# Patient Record
Sex: Female | Born: 1960 | Race: White | Hispanic: No | Marital: Married | Smoking: Current every day smoker
Health system: Southern US, Community
[De-identification: ages and names within clinical notes are randomized; demographics above are authoritative.]

## PROBLEM LIST (undated history)

## (undated) DIAGNOSIS — T7840XA Allergy, unspecified, initial encounter: Secondary | ICD-10-CM

## (undated) DIAGNOSIS — J42 Unspecified chronic bronchitis: Secondary | ICD-10-CM

## (undated) DIAGNOSIS — Z720 Tobacco use: Secondary | ICD-10-CM

## (undated) DIAGNOSIS — K219 Gastro-esophageal reflux disease without esophagitis: Secondary | ICD-10-CM

## (undated) HISTORY — DX: Allergy, unspecified, initial encounter: T78.40XA

## (undated) HISTORY — DX: Tobacco use: Z72.0

## (undated) HISTORY — DX: Unspecified chronic bronchitis: J42

## (undated) HISTORY — DX: Gastro-esophageal reflux disease without esophagitis: K21.9

---

## 2005-05-30 ENCOUNTER — Ambulatory Visit: Payer: Self-pay

## 2005-06-13 ENCOUNTER — Ambulatory Visit: Payer: Self-pay | Admitting: Unknown Physician Specialty

## 2006-06-27 ENCOUNTER — Encounter: Payer: Self-pay | Admitting: Specialist

## 2006-06-29 ENCOUNTER — Encounter: Payer: Self-pay | Admitting: Specialist

## 2006-07-29 ENCOUNTER — Encounter: Payer: Self-pay | Admitting: Specialist

## 2006-09-04 ENCOUNTER — Ambulatory Visit: Payer: Self-pay

## 2007-11-26 ENCOUNTER — Ambulatory Visit: Payer: Self-pay

## 2008-12-01 ENCOUNTER — Ambulatory Visit: Payer: Self-pay

## 2010-01-29 LAB — HM COLONOSCOPY: HM Colonoscopy: NORMAL

## 2010-04-17 ENCOUNTER — Emergency Department: Payer: Self-pay | Admitting: Emergency Medicine

## 2010-04-19 ENCOUNTER — Ambulatory Visit: Payer: Self-pay | Admitting: Internal Medicine

## 2010-05-12 ENCOUNTER — Ambulatory Visit: Payer: Self-pay | Admitting: Internal Medicine

## 2010-06-07 ENCOUNTER — Ambulatory Visit: Payer: Self-pay | Admitting: Gastroenterology

## 2010-06-12 LAB — PATHOLOGY REPORT

## 2011-08-10 ENCOUNTER — Other Ambulatory Visit: Payer: Self-pay | Admitting: Internal Medicine

## 2011-08-10 MED ORDER — CITALOPRAM HYDROBROMIDE 20 MG PO TABS: 20.0000 mg | ORAL_TABLET | Freq: Every day | ORAL | Status: DC

## 2011-09-24 ENCOUNTER — Encounter: Payer: Self-pay | Admitting: Internal Medicine

## 2011-09-24 ENCOUNTER — Ambulatory Visit (INDEPENDENT_AMBULATORY_CARE_PROVIDER_SITE_OTHER): Payer: PRIVATE HEALTH INSURANCE | Admitting: Internal Medicine

## 2011-09-24 DIAGNOSIS — Z7189 Other specified counseling: Secondary | ICD-10-CM

## 2011-09-24 DIAGNOSIS — F410 Panic disorder [episodic paroxysmal anxiety] without agoraphobia: Secondary | ICD-10-CM

## 2011-09-24 DIAGNOSIS — Z716 Tobacco abuse counseling: Secondary | ICD-10-CM

## 2011-09-24 DIAGNOSIS — F329 Major depressive disorder, single episode, unspecified: Secondary | ICD-10-CM

## 2011-09-24 DIAGNOSIS — I10 Essential (primary) hypertension: Secondary | ICD-10-CM

## 2011-09-24 MED ORDER — AMLODIPINE BESYLATE 5 MG PO TABS: 5.0000 mg | ORAL_TABLET | Freq: Every day | ORAL | Status: DC

## 2011-09-24 MED ORDER — CITALOPRAM HYDROBROMIDE 20 MG PO TABS: 40.0000 mg | ORAL_TABLET | Freq: Every day | ORAL | Status: DC

## 2011-09-24 MED ORDER — DIAZEPAM 5 MG PO TABS: 5.0000 mg | ORAL_TABLET | Freq: Two times a day (BID) | ORAL | Status: DC | PRN

## 2011-09-24 MED ORDER — ALPRAZOLAM 0.5 MG PO TABS: 0.5000 mg | ORAL_TABLET | Freq: Every evening | ORAL | Status: DC | PRN

## 2011-09-24 NOTE — Patient Instructions (Signed)
I want you to take a 20 minute walk outside during the day 3 days a week ,  Goal 5  Come back in one month

## 2011-09-24 NOTE — Progress Notes (Signed)
  Subjective:    Patient ID: Ann Baxter, female    DOB: 1961/07/15, 50 y.o.   MRN: [id number]  HPI  Ann Baxter is a 50 you white female with a history of tobacco abuse, anxiety, moderate alvohol use who presents with full blown depression accompanied by daily panic attacks.  Her current symptoms were precipitated by the recent death of father and several (6) close friends since June.She received Hospice counselling for a few weeks but stopped because she didn't see the benefit in talking about death over and over again. Her symptoms are aggravated by menopause, as she is experiencing hot flashes,  Lack of libido,  insomnia, inactitvy, loss of motivation, anhedonia, hyperphagia.  She is not suicidal.  Prior trials of effexor caused insomnia and itching.  She currently taking celexa 20 mg daily with no appreciable change in symptoms since June , her last visit .     Review of Systems  Constitutional: Positive for appetite change. Negative for fever, chills and unexpected weight change.  HENT: Negative for hearing loss, ear pain, nosebleeds, congestion, sore throat, facial swelling, rhinorrhea, sneezing, mouth sores, trouble swallowing, neck pain, neck stiffness, voice change, postnasal drip, sinus pressure, tinnitus and ear discharge.   Eyes: Negative for pain, discharge, redness and visual disturbance.  Respiratory: Negative for cough, chest tightness, shortness of breath, wheezing and stridor.   Cardiovascular: Negative for chest pain, palpitations and leg swelling.  Musculoskeletal: Negative for myalgias and arthralgias.  Skin: Negative for color change and rash.  Neurological: Negative for dizziness, weakness, light-headedness and headaches.  Hematological: Negative for adenopathy.  Psychiatric/Behavioral: Positive for sleep disturbance and dysphoric mood. Negative for suicidal ideas and self-injury. The patient is nervous/anxious.        Objective:   Physical Exam  Constitutional: She is  oriented to person, place, and time. She appears well-developed and well-nourished.  HENT:  Mouth/Throat: Oropharynx is clear and moist.  Eyes: EOM are normal. Pupils are equal, round, and reactive to light. No scleral icterus.  Neck: Normal range of motion. Neck supple. No JVD present. No thyromegaly present.  Cardiovascular: Normal rate, regular rhythm, normal heart sounds and intact distal pulses.   Pulmonary/Chest: Effort normal and breath sounds normal.  Abdominal: Soft. Bowel sounds are normal. She exhibits no mass. There is no tenderness.  Musculoskeletal: Normal range of motion. She exhibits no edema.  Lymphadenopathy:    She has no cervical adenopathy.  Neurological: She is alert and oriented to person, place, and time.  Skin: Skin is warm and dry.  Psychiatric: Her speech is normal and behavior is normal. Judgment and thought content normal. Her mood appears anxious. Cognition and memory are normal. She exhibits a depressed mood.       Assessment & Plan:  Depression:  Discussed options of increasing her citalopram to 40 mg daily vs change to wellbutrin which may provide the benefit of decreased tobacco dependence.  Since she is not motivated to quit currently, I assessed her risk for tobacco cessation failure currently to be high.  Will increase the citalopram.  Referral to  behavioral health for psychotherapy.  Panic disorder.  painc attacks occurring frequently,  No history of PTSD or domestic violence.  Trial of alprazolam for prn use.  F/u in one monht.

## 2011-09-25 ENCOUNTER — Encounter: Payer: Self-pay | Admitting: Internal Medicine

## 2011-09-25 DIAGNOSIS — Z720 Tobacco use: Secondary | ICD-10-CM | POA: Insufficient documentation

## 2011-09-25 DIAGNOSIS — J42 Unspecified chronic bronchitis: Secondary | ICD-10-CM | POA: Insufficient documentation

## 2011-09-25 DIAGNOSIS — F331 Major depressive disorder, recurrent, moderate: Secondary | ICD-10-CM | POA: Insufficient documentation

## 2011-09-25 DIAGNOSIS — Z716 Tobacco abuse counseling: Secondary | ICD-10-CM | POA: Insufficient documentation

## 2011-09-25 DIAGNOSIS — K219 Gastro-esophageal reflux disease without esophagitis: Secondary | ICD-10-CM | POA: Insufficient documentation

## 2011-09-25 DIAGNOSIS — T7840XA Allergy, unspecified, initial encounter: Secondary | ICD-10-CM | POA: Insufficient documentation

## 2011-09-25 NOTE — Assessment & Plan Note (Signed)
We discussed her risks of CA, CAD, and COPD and assessed her current level of interest in quitting.  She understands the need to quit but gien her uncontrolled depression and anxiety does not feel she can take on this challenge at this time.  Will repeat discussion at one month in followup.

## 2011-10-26 ENCOUNTER — Ambulatory Visit (INDEPENDENT_AMBULATORY_CARE_PROVIDER_SITE_OTHER): Payer: PRIVATE HEALTH INSURANCE | Admitting: Internal Medicine

## 2011-10-26 ENCOUNTER — Encounter: Payer: Self-pay | Admitting: Internal Medicine

## 2011-10-26 VITALS — BP 122/70 | HR 100 | Temp 98.8°F | Wt 142.0 lb

## 2011-10-26 DIAGNOSIS — F329 Major depressive disorder, single episode, unspecified: Secondary | ICD-10-CM

## 2011-10-26 DIAGNOSIS — K219 Gastro-esophageal reflux disease without esophagitis: Secondary | ICD-10-CM

## 2011-10-26 DIAGNOSIS — Z23 Encounter for immunization: Secondary | ICD-10-CM

## 2011-10-26 MED ORDER — CITALOPRAM HYDROBROMIDE 40 MG PO TABS: 40.0000 mg | ORAL_TABLET | Freq: Every day | ORAL | Status: DC

## 2011-10-26 MED ORDER — OMEPRAZOLE 40 MG PO CPDR: 40.0000 mg | DELAYED_RELEASE_CAPSULE | Freq: Every day | ORAL | Status: DC

## 2011-10-26 NOTE — Patient Instructions (Addendum)
Continue 40 mg celexa.  Use the valium as needed for tension headaches and overall tnesion  Use the alprazolam for extreme anxiety.    Return in 3 months for a full physical including PAP smear

## 2011-10-26 NOTE — Progress Notes (Signed)
  Subjective:    Patient ID: Ann Baxter, female    DOB: [birth year], 50 y.o.   MRN: [id number]  HPI  Ann Baxter returns for one month for follow up on titrate of medications for treatment  of major depressive episode precipitated by the death of her father several months ago.     She feesl significantly better, on the 40 mg of celexa daily.  She reports less tearfulness, insomnia and anxeity and her husband has commented on the significant difference as well.  She is using 1/2 tablet of alprazolam as needed for extreme anxiety less than once daily, and has used the valium rarely, mostly for treatment of muscle tension headaches.  Her weight loss has stopped and she has  gained 3 lbs.  She is not exericising yet, and her libido is low but she is not worried about that currently.  Past Medical History  Diagnosis Date  . Allergy   . Bronchitis, chronic   . Tobacco abuse   . GERD (gastroesophageal reflux disease)     H Pylori Positive, treated July 2011   Current Outpatient Prescriptions on File Prior to Visit  Medication Sig Dispense Refill  . ALPRAZolam (XANAX) 0.5 MG tablet Take 1 tablet (0.5 mg total) by mouth at bedtime as needed (panic attack).  30 tablet  3  . amLODipine (NORVASC) 5 MG tablet Take 1 tablet (5 mg total) by mouth daily.  30 tablet  11  . cetirizine (ZYRTEC) 10 MG tablet Take 10 mg by mouth daily.        . Multiple Vitamin (MULTIVITAMIN) tablet Take 1 tablet by mouth daily.        . diazepam (VALIUM) 5 MG tablet Take 1 tablet (5 mg total) by mouth every 12 (twelve) hours as needed (muscle spasm ).  60 tablet  3    Review of Systems  Constitutional: Negative for fever, chills and unexpected weight change.  HENT: Negative for hearing loss, ear pain, nosebleeds, congestion, sore throat, facial swelling, rhinorrhea, sneezing, mouth sores, trouble swallowing, neck pain, neck stiffness, voice change, postnasal drip, sinus pressure, tinnitus and ear discharge.   Eyes: Negative for  pain, discharge, redness and visual disturbance.  Respiratory: Negative for cough, chest tightness, shortness of breath, wheezing and stridor.   Cardiovascular: Negative for chest pain, palpitations and leg swelling.  Musculoskeletal: Negative for myalgias and arthralgias.  Skin: Negative for color change and rash.  Neurological: Negative for dizziness, weakness, light-headedness and headaches.  Hematological: Negative for adenopathy.  Psychiatric/Behavioral: The patient is nervous/anxious.       BP 122/70  Pulse 100  Temp(Src) 98.8 F (37.1 C) (Oral)  Wt 142 lb (64.411 kg)     Objective:   Physical Exam  Constitutional: She is oriented to person, place, and time. She appears well-developed and well-nourished.  Eyes: No scleral icterus.  Neck: Normal range of motion. Neck supple. No JVD present. No thyromegaly present.  Cardiovascular: Normal rate, regular rhythm, normal heart sounds and intact distal pulses.   Pulmonary/Chest: Effort normal and breath sounds normal.  Musculoskeletal: Normal range of motion. She exhibits no edema.  Lymphadenopathy:    She has no cervical adenopathy.  Neurological: She is alert and oriented to person, place, and time.  Skin: Skin is warm and dry.  Psychiatric: She has a normal mood and affect.          Assessment & Plan:  1) NMajor depressive disorede 2) Hypertension 3) Tobacco abuse

## 2011-10-28 ENCOUNTER — Encounter: Payer: Self-pay | Admitting: Internal Medicine

## 2011-10-28 NOTE — Assessment & Plan Note (Signed)
Improved symptoms with increased celexa dose of 40 mg daily.  Continue for another 3 months.  Will review sympotoms again at her next visit.

## 2011-11-01 LAB — HM MAMMOGRAPHY: HM Mammogram: NORMAL

## 2011-12-12 ENCOUNTER — Ambulatory Visit: Payer: Self-pay | Admitting: Internal Medicine

## 2011-12-13 ENCOUNTER — Telehealth: Payer: Self-pay | Admitting: Internal Medicine

## 2011-12-13 NOTE — Telephone Encounter (Signed)
Her mammogram was normal.  Repeat in one year 

## 2011-12-24 ENCOUNTER — Encounter: Payer: Self-pay | Admitting: *Deleted

## 2011-12-24 NOTE — Telephone Encounter (Signed)
Letter mailed notifying patient that mammogram was normal.

## 2012-01-03 ENCOUNTER — Encounter: Payer: Self-pay | Admitting: Internal Medicine

## 2012-01-25 ENCOUNTER — Encounter: Payer: PRIVATE HEALTH INSURANCE | Admitting: Internal Medicine

## 2012-01-30 ENCOUNTER — Ambulatory Visit (INDEPENDENT_AMBULATORY_CARE_PROVIDER_SITE_OTHER): Payer: PRIVATE HEALTH INSURANCE | Admitting: Internal Medicine

## 2012-01-30 ENCOUNTER — Encounter: Payer: Self-pay | Admitting: Internal Medicine

## 2012-01-30 ENCOUNTER — Other Ambulatory Visit (HOSPITAL_COMMUNITY)
Admission: RE | Admit: 2012-01-30 | Discharge: 2012-01-30 | Disposition: A | Discharge status: Home / Self Care | Payer: PRIVATE HEALTH INSURANCE | Source: Ambulatory Visit | Attending: Internal Medicine | Admitting: Internal Medicine

## 2012-01-30 DIAGNOSIS — Z1159 Encounter for screening for other viral diseases: Secondary | ICD-10-CM | POA: Insufficient documentation

## 2012-01-30 DIAGNOSIS — F329 Major depressive disorder, single episode, unspecified: Secondary | ICD-10-CM

## 2012-01-30 DIAGNOSIS — J309 Allergic rhinitis, unspecified: Secondary | ICD-10-CM

## 2012-01-30 DIAGNOSIS — Z7189 Other specified counseling: Secondary | ICD-10-CM

## 2012-01-30 DIAGNOSIS — Z716 Tobacco abuse counseling: Secondary | ICD-10-CM

## 2012-01-30 DIAGNOSIS — K219 Gastro-esophageal reflux disease without esophagitis: Secondary | ICD-10-CM

## 2012-01-30 DIAGNOSIS — N760 Acute vaginitis: Secondary | ICD-10-CM | POA: Insufficient documentation

## 2012-01-30 DIAGNOSIS — Z124 Encounter for screening for malignant neoplasm of cervix: Secondary | ICD-10-CM

## 2012-01-30 DIAGNOSIS — F3289 Other specified depressive episodes: Secondary | ICD-10-CM

## 2012-01-30 DIAGNOSIS — Z01419 Encounter for gynecological examination (general) (routine) without abnormal findings: Secondary | ICD-10-CM | POA: Insufficient documentation

## 2012-01-30 DIAGNOSIS — Z0000 Encounter for general adult medical examination without abnormal findings: Secondary | ICD-10-CM

## 2012-01-30 DIAGNOSIS — K209 Esophagitis, unspecified without bleeding: Secondary | ICD-10-CM

## 2012-01-30 MED ORDER — MONTELUKAST SODIUM 10 MG PO TABS: 10.0000 mg | ORAL_TABLET | Freq: Every day | ORAL | Status: DC

## 2012-01-30 MED ORDER — ESOMEPRAZOLE MAGNESIUM 40 MG PO CPDR: 40.0000 mg | DELAYED_RELEASE_CAPSULE | Freq: Every day | ORAL | Status: DC

## 2012-01-30 MED ORDER — HYOSCYAMINE SULFATE 0.125 MG SL SUBL: 0.1250 mg | SUBLINGUAL_TABLET | SUBLINGUAL | Status: DC | PRN

## 2012-01-30 NOTE — Progress Notes (Signed)
Patient ID: Ann Baxter, female   DOB: Apr 06, 1961, 51 y.o.   MRN: [id number] Patient Active Problem List  Diagnoses  . Allergy  . GERD (gastroesophageal reflux disease)  . Bronchitis, chronic  . Tobacco abuse  . Tobacco abuse counseling  . Depressive state  . Rhinitis, allergic  . Annual physical exam    Subjective:  CC:   Chief Complaint  Patient presents with  . Gynecologic Exam    HPI:   Ann Baxter a 51 y.o. female who presents for annual gyn exam and follow up on acute and chronic medical issues. .  Her depression has improved with initiation of therapy and she is starting to get her libido back. She has been having increased dyspepsia.  Has a history of  h pylori , treated in 2011, with recent recurrence of abd/chest pain that woke her up from sleep.  Has not been using a daily PPI., Still smoking.  Third issiue is allergic rhinitis with congestion despite daily use of zyrtec.     Past Medical History  Diagnosis Date  . Allergy   . Bronchitis, chronic   . Tobacco abuse   . GERD (gastroesophageal reflux disease)     H Pylori Positive, treated July 2011    Past Surgical History  Procedure Date  . Cesarean section 1986         The following portions of the patient's history were reviewed and updated as appropriate: Allergies, current medications, and problem list.    Review of Systems:   12 Pt  review of systems was negative except those addressed in the HPI,     History   Social History  . Marital Status: Married    Spouse Name: N/A    Number of Children: N/A  . Years of Education: N/A   Occupational History  . Not on file.   Social History Main Topics  . Smoking status: Current Everyday Smoker -- 1.0 packs/day    Types: Cigarettes  . Smokeless tobacco: Never Used  . Alcohol Use: 3.5 oz/week    7 drink(s) per week  . Drug Use: No  . Sexually Active: Not Currently    Birth Control/ Protection: Abstinence   Other Topics Concern  .  Not on file   Social History Narrative  . No narrative on file    Objective:  BP 120/68  Pulse 100  Temp(Src) 98 F (36.7 C) (Oral)  Resp 16  Wt 140 lb 8 oz (63.73 kg)  SpO2 99%    General Appearance:    Alert, cooperative, no distress, appears stated age  Head:    Normocephalic, without obvious abnormality, atraumatic  Eyes:    PERRL, conjunctiva/corneas clear, EOM's intact, fundi    benign, both eyes  Ears:    Normal TM's and external ear canals, both ears  Nose:   Nares normal, septum midline, mucosa normal, no drainage    or sinus tenderness  Throat:   Lips, mucosa, and tongue normal; teeth and gums normal  Neck:   Supple, symmetrical, trachea midline, no adenopathy;    thyroid:  no enlargement/tenderness/nodules; no carotid   bruit or JVD  Back:     Symmetric, no curvature, ROM normal, no CVA tenderness  Lungs:     Clear to auscultation bilaterally, respirations unlabored  Chest Wall:    No tenderness or deformity   Heart:    Regular rate and rhythm, S1 and S2 normal, no murmur, rub   or gallop  Breast  Exam:    No tenderness, masses, or nipple abnormality  Abdomen:     Soft, non-tender, bowel sounds active all four quadrants,    no masses, no organomegaly  Genitalia:    Normal female without lesion, discharge or tenderness  Rectal:    Normal tone, normal prostate, no masses or tenderness;   guaiac negative stool  Extremities:   Extremities normal, atraumatic, no cyanosis or edema  Pulses:   2+ and symmetric all extremities  Skin:   Skin color, texture, turgor normal, no rashes or lesions  Lymph nodes:   Cervical, supraclavicular, and axillary nodes normal  Neurologic:   CNII-XII intact, normal strength, sensation and reflexes    throughout    Assessment and Plan:  Depressive state improved with initiation of SSRI therapy.  No changes today  Tobacco abuse counseling I have again advised her of the risks for heart disease increased risk of breast cancer and  lung disease with continued tobacco abuse. She is in a contemplative stages she does not want pharmacotherapy at this point since she is just gotten her that her depression under manageable state.  GERD (gastroesophageal reflux disease) She does not want to undergo the testing for H. pylori which at this point would require stool testing. We will resume PPI therapy and if symptoms do not improve she will need to see gastroenterologist.  Rhinitis, allergic Discussed adding a second line agent, Singulair daily Zyrtec. She does not want to use a steroid nasal spray. Prescription for Singulair written.  Annual physical exam Annual breast and pelvic exam with Pap smear was done today. She is up-to-date on mammograms. She has declined any referral for colonoscopy for colon cancer screening.    Updated Medication List Outpatient Encounter Prescriptions as of 01/30/2012  Medication Sig Dispense Refill  . ALPRAZolam (XANAX) 0.5 MG tablet Take 1 tablet (0.5 mg total) by mouth at bedtime as needed (panic attack).  30 tablet  3  . amLODipine (NORVASC) 5 MG tablet Take 1 tablet (5 mg total) by mouth daily.  30 tablet  11  . cetirizine (ZYRTEC) 10 MG tablet Take 10 mg by mouth daily.        . citalopram (CELEXA) 40 MG tablet Take 1 tablet (40 mg total) by mouth daily.  30 tablet  5  . diazepam (VALIUM) 5 MG tablet Take 1 tablet (5 mg total) by mouth every 12 (twelve) hours as needed (muscle spasm ).  60 tablet  3  . Multiple Vitamin (MULTIVITAMIN) tablet Take 1 tablet by mouth daily.        Marland Kitchen omeprazole (PRILOSEC) 40 MG capsule Take 1 capsule (40 mg total) by mouth daily.  30 capsule  5  . esomeprazole (NEXIUM) 40 MG capsule Take 1 capsule (40 mg total) by mouth daily.  30 capsule  3  . hyoscyamine (LEVSIN/SL) 0.125 MG SL tablet Place 1 tablet (0.125 mg total) under the tongue every 4 (four) hours as needed for cramping.  30 tablet  0  . montelukast (SINGULAIR) 10 MG tablet Take 1 tablet (10 mg total) by  mouth at bedtime.  30 tablet  3     Orders Placed This Encounter  Procedures  . HM MAMMOGRAPHY  . HM COLONOSCOPY    No Follow-up on file.

## 2012-01-30 NOTE — Patient Instructions (Signed)
I am prescribing singulair for your allergies .  You can continue zyrtec as well  I am changing your omeprazole to nexium for your stomach problems.  If it does not help,  We will need to do the stool test for h Pylori.   The hyoscyamine is an antispasmodic for your next attack of esophageal pain

## 2012-01-31 ENCOUNTER — Telehealth: Payer: Self-pay | Admitting: *Deleted

## 2012-01-31 NOTE — Telephone Encounter (Signed)
Triage Record Num: [id number] Operator: Jeraldine Loots Patient Name: Ann Baxter Call Date & Time: 01/31/2012 2:48:31PM Patient Phone: [phone] PCP: Duncan Dull Patient Gender: Female PCP Fax : [phone] Patient DOB: Dec 27, 1960 Practice Name: [facility] Reason for Call: Caller: Ann Baxter/Patient; PCP: Duncan Dull; CB#: [id number]; Had annual exam 4/3 and a pap smear. Was told that she might have a yeast infection but was told that they would wait on the cx results. Today has swelling in vaginal area with irritation. No fever. No problems with urination. LMP 1/13. [facility] [place] [phone]. PLEASE NOTIFY WHEN MEDS ARE CALLED . Traiged per Vaginal Discharge/Irritation Protocol(s) Used: Vaginal Discharge or Irritation Recommended Outcome per Protocol: See Provider within 24 hours Reason for Outcome: Genital itching, burning or redness Care Advice: ~ SYMPTOM / CONDITION MANAGEMENT Refrain from douching, using scented deodorant tampons, or nonprescription medication until evaluated by provider. Do not use feminine hygiene sprays. Use condoms during sex. ~ 04/

## 2012-02-01 DIAGNOSIS — Z0000 Encounter for general adult medical examination without abnormal findings: Secondary | ICD-10-CM | POA: Insufficient documentation

## 2012-02-01 DIAGNOSIS — J309 Allergic rhinitis, unspecified: Secondary | ICD-10-CM | POA: Insufficient documentation

## 2012-02-01 LAB — HM PAP SMEAR: HM PAP: NORMAL

## 2012-02-01 NOTE — Assessment & Plan Note (Signed)
Discussed adding a second line agent, Singulair daily Zyrtec. She does not want to use a steroid nasal spray. Prescription for Singulair written.

## 2012-02-01 NOTE — Assessment & Plan Note (Signed)
improved with initiation of SSRI therapy.  No changes today

## 2012-02-01 NOTE — Telephone Encounter (Signed)
Rx has been called in.  Patient notified. 

## 2012-02-01 NOTE — Assessment & Plan Note (Signed)
Annual breast and pelvic exam with Pap smear was done today. She is up-to-date on mammograms. She has declined any referral for colonoscopy for colon cancer screening.

## 2012-02-01 NOTE — Telephone Encounter (Signed)
You can call her in fluconazole 150 mg one tablet daily for 2 days  #2 no refills. PAP smear is not back yet.

## 2012-02-01 NOTE — Assessment & Plan Note (Signed)
She does not want to undergo the testing for H. pylori which at this point would require stool testing. We will resume PPI therapy and if symptoms do not improve she will need to see gastroenterologist.

## 2012-02-01 NOTE — Assessment & Plan Note (Signed)
I have again advised her of the risks for heart disease increased risk of breast cancer and lung disease with continued tobacco abuse. She is in a contemplative stages she does not want pharmacotherapy at this point since she is just gotten her that her depression under manageable state.

## 2012-02-04 ENCOUNTER — Telehealth: Payer: Self-pay | Admitting: Internal Medicine

## 2012-02-04 NOTE — Telephone Encounter (Signed)
[phone] Pt called pt still doesn't feel right in private area/inside. ovarys hurt,no discharge, yeast infection not cleared up.  Has pt pap smear results come back yet Please advise pt what to do

## 2012-02-05 NOTE — Telephone Encounter (Signed)
Left message asking patient to return my call.

## 2012-02-05 NOTE — Telephone Encounter (Signed)
Patient called back and stated she thinks she may have had an allergic reaction after her PAP on Wednesday because Thursday when she woke up her vagina was red, swollen, and painful.  She thought it may have been from the possible yeast infection but she never had the discharge.  She stated she feels better since taking the diflucan but still not 100% better.  She wanted to know if there is anything else she should do.  Please advise.

## 2012-02-05 NOTE — Telephone Encounter (Signed)
See message below °

## 2012-02-05 NOTE — Telephone Encounter (Signed)
Have her take benadryl 25 mg every 8 hours for 2-3 days to see if it is an allelrgic reaction..  If no imporvement make appt to be seen.

## 2012-02-06 NOTE — Telephone Encounter (Signed)
Patient is feeling better now.

## 2012-02-25 ENCOUNTER — Encounter: Payer: PRIVATE HEALTH INSURANCE | Admitting: Internal Medicine

## 2012-03-06 ENCOUNTER — Telehealth: Payer: Self-pay | Admitting: Internal Medicine

## 2012-03-06 NOTE — Telephone Encounter (Signed)
Wanting a call from the doctor about her medication Celexa.

## 2012-03-07 NOTE — Telephone Encounter (Signed)
I spoke with patient she wanted to know if Dr. Darrick Huntsman had spoken with her counselor.  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  Dr. Darrick Huntsman advised that the counselor send her note and she would be able to respond better that way.  Patient did not want to make an appt with Dr. Darrick Huntsman.  Patient notified that counselor needs to send Dr. Darrick Huntsman a letter.

## 2012-03-19 ENCOUNTER — Telehealth: Payer: Self-pay | Admitting: Internal Medicine
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
## 2012-03-25 ENCOUNTER — Ambulatory Visit: Payer: PRIVATE HEALTH INSURANCE | Admitting: Internal Medicine

## 2012-04-13 ENCOUNTER — Emergency Department: Payer: Self-pay | Admitting: Emergency Medicine

## 2012-05-02 ENCOUNTER — Ambulatory Visit: Payer: Self-pay | Admitting: Internal Medicine

## 2012-05-04 ENCOUNTER — Other Ambulatory Visit: Payer: Self-pay | Admitting: Internal Medicine

## 2012-05-05 ENCOUNTER — Telehealth: Payer: Self-pay | Admitting: Internal Medicine

## 2012-05-05 NOTE — Telephone Encounter (Signed)
Left detailed message notifying patient.

## 2012-05-05 NOTE — Telephone Encounter (Signed)
Her x ray revealed no fractures of ribs

## 2012-05-16 ENCOUNTER — Encounter: Payer: Self-pay | Admitting: Internal Medicine

## 2012-05-23 ENCOUNTER — Other Ambulatory Visit: Payer: Self-pay | Admitting: Internal Medicine

## 2012-05-23 DIAGNOSIS — F410 Panic disorder [episodic paroxysmal anxiety] without agoraphobia: Secondary | ICD-10-CM

## 2012-05-23 MED ORDER — ALPRAZOLAM 0.5 MG PO TABS: 0.5000 mg | ORAL_TABLET | Freq: Every evening | ORAL | Status: DC | PRN

## 2012-05-23 MED ORDER — DIAZEPAM 5 MG PO TABS: 5.0000 mg | ORAL_TABLET | Freq: Two times a day (BID) | ORAL | Status: DC | PRN

## 2012-05-23 NOTE — Telephone Encounter (Signed)
[facility]

## 2012-06-04 ENCOUNTER — Other Ambulatory Visit: Payer: Self-pay | Admitting: Internal Medicine

## 2012-07-22 ENCOUNTER — Emergency Department: Payer: Self-pay | Admitting: Emergency Medicine

## 2012-07-22 LAB — CBC
MCHC: 34.6 g/dL (ref 32.0–36.0)
MCV: 90 fL (ref 80–100)
RBC: 5.01 10*6/uL (ref 3.80–5.20)
RDW: 13.4 % (ref 11.5–14.5)
WBC: 8.5 10*3/uL (ref 3.6–11.0)

## 2012-07-22 LAB — TROPONIN I
Troponin-I: <0.02 ng/mL
Troponin-I: <0.02 ng/mL

## 2012-07-22 LAB — CK TOTAL AND CKMB (NOT AT ARMC): CK-MB: <0.5 ng/mL — ABNORMAL LOW (ref 0.5–3.6)

## 2012-07-22 LAB — COMPREHENSIVE METABOLIC PANEL
Anion Gap: 10 (ref 7–16)
BUN: 21 mg/dL — ABNORMAL HIGH (ref 7–18)
Bilirubin,Total: 0.7 mg/dL (ref 0.2–1.0)
Chloride: 105 mmol/L (ref 98–107)
Creatinine: 0.67 mg/dL (ref 0.60–1.30)
EGFR (African American): >60
EGFR (Non-African Amer.): >60
Osmolality: 283 (ref 275–301)
Potassium: 3.7 mmol/L (ref 3.5–5.1)
Total Protein: 7.4 g/dL (ref 6.4–8.2)

## 2012-09-20 ENCOUNTER — Other Ambulatory Visit: Payer: Self-pay | Admitting: Internal Medicine

## 2012-11-05 ENCOUNTER — Other Ambulatory Visit: Payer: Self-pay | Admitting: Internal Medicine

## 2012-12-02 ENCOUNTER — Other Ambulatory Visit: Payer: Self-pay | Admitting: Internal Medicine

## 2012-12-14 ENCOUNTER — Other Ambulatory Visit: Payer: Self-pay

## 2012-12-16 ENCOUNTER — Ambulatory Visit: Payer: Self-pay | Admitting: Internal Medicine

## 2012-12-18 ENCOUNTER — Ambulatory Visit: Payer: Self-pay | Admitting: Internal Medicine

## 2012-12-31 ENCOUNTER — Encounter: Payer: Self-pay | Admitting: Internal Medicine

## 2013-01-08 ENCOUNTER — Encounter: Payer: Self-pay | Admitting: Internal Medicine

## 2013-01-12 ENCOUNTER — Ambulatory Visit (INDEPENDENT_AMBULATORY_CARE_PROVIDER_SITE_OTHER): Payer: PRIVATE HEALTH INSURANCE | Admitting: Adult Health

## 2013-01-12 ENCOUNTER — Ambulatory Visit (INDEPENDENT_AMBULATORY_CARE_PROVIDER_SITE_OTHER)
Admission: RE | Admit: 2013-01-12 | Discharge: 2013-01-12 | Disposition: A | Discharge status: Home / Self Care | Payer: PRIVATE HEALTH INSURANCE | Source: Ambulatory Visit | Attending: Adult Health | Admitting: Adult Health

## 2013-01-12 ENCOUNTER — Encounter: Payer: Self-pay | Admitting: Adult Health

## 2013-01-12 VITALS — BP 130/84 | HR 86 | Temp 98.9°F | Resp 16 | Wt 141.2 lb

## 2013-01-12 DIAGNOSIS — W19XXXA Unspecified fall, initial encounter: Secondary | ICD-10-CM

## 2013-01-12 DIAGNOSIS — Y92009 Unspecified place in unspecified non-institutional (private) residence as the place of occurrence of the external cause: Secondary | ICD-10-CM

## 2013-01-12 DIAGNOSIS — M25519 Pain in unspecified shoulder: Secondary | ICD-10-CM

## 2013-01-12 MED ORDER — HYDROCODONE-ACETAMINOPHEN 5-325 MG PO TABS: 1.0000 | ORAL_TABLET | Freq: Four times a day (QID) | ORAL | Status: DC | PRN

## 2013-01-12 MED ORDER — CARISOPRODOL 250 MG PO TABS: 250.0000 mg | ORAL_TABLET | Freq: Four times a day (QID) | ORAL | Status: DC

## 2013-01-12 NOTE — Progress Notes (Signed)
Subjective:    Patient ID: Ann Baxter, female    DOB: 1960/12/03, 52 y.o.   MRN: [id number]  HPI  Patient presents to clinic after falling at home this morning. She was coming out of her home and slipped on the brick steps. She landed on her buttocks and tried to break the fall by using her right arm. She has pain in the sacral area but she is able to move around without difficulty. She is having trouble with her right shoulder. She cannot fully abduct the right arm. She denies any new numbness or tingling. Patient has a hx of carpel tunnel on the right but says that the numbness is not any worse since the fall.    Current Outpatient Prescriptions on File Prior to Visit  Medication Sig Dispense Refill  . ALPRAZolam (XANAX) 0.5 MG tablet Take 1 tablet (0.5 mg total) by mouth at bedtime as needed (panic attack).  30 tablet  3  . amLODipine (NORVASC) 5 MG tablet TAKE 1 TABLET BY MOUTH ONCE A DAY  30 tablet  11  . citalopram (CELEXA) 40 MG tablet TAKE 1 TABLET BY MOUTH EVERY DAY  30 tablet  5  . diazepam (VALIUM) 5 MG tablet Take 1 tablet (5 mg total) by mouth every 12 (twelve) hours as needed (muscle spasm ).  60 tablet  3  . Multiple Vitamin (MULTIVITAMIN) tablet Take 1 tablet by mouth daily.        Marland Kitchen omeprazole (PRILOSEC) 40 MG capsule TAKE ONE CAPSULE BY MOUTH EVERY DAY  30 capsule  5  . cetirizine (ZYRTEC) 10 MG tablet Take 10 mg by mouth daily.        Marland Kitchen esomeprazole (NEXIUM) 40 MG capsule Take 1 capsule (40 mg total) by mouth daily.  30 capsule  3  . hyoscyamine (LEVSIN/SL) 0.125 MG SL tablet Place 1 tablet (0.125 mg total) under the tongue every 4 (four) hours as needed for cramping.  30 tablet  0  . montelukast (SINGULAIR) 10 MG tablet Take 1 tablet (10 mg total) by mouth at bedtime.  30 tablet  3   No current facility-administered medications on file prior to visit.     Review of Systems  HENT: Negative for neck pain and neck stiffness.   Respiratory: Negative.   Cardiovascular:  Negative.   Musculoskeletal: Positive for back pain.       Pain right shoulder.  Neurological: Negative for tremors and numbness.  Hematological:       No bruising  Psychiatric/Behavioral: Negative.    BP 130/84  Pulse 86  Temp(Src) 98.9 F (37.2 C) (Oral)  Resp 16  Wt 141 lb 4 oz (64.071 kg)  BMI 25.83 kg/m2  SpO2 97%     Objective:   Physical Exam  Constitutional: She is oriented to person, place, and time. She appears well-developed and well-nourished.  HENT:  Head: Normocephalic and atraumatic.  Neck: Normal range of motion.  Pulmonary/Chest: Effort normal.  Musculoskeletal: She exhibits edema and tenderness.  Right shoulder pain. Unable to abduct arm fully. Very limited ROM. Patient is s/p fall. Slight edema on right palm as compared to the left hand. No noticeable swelling on lower back. No bruising. Not painful to touch or palpation.  Neurological: She is alert and oriented to person, place, and time.  Skin: Skin is warm and dry.  Psychiatric: She has a normal mood and affect. Her behavior is normal. Judgment and thought content normal.       Assessment &  Plan:

## 2013-01-12 NOTE — Assessment & Plan Note (Signed)
Patient fell as she was coming down the steps at the front of her home. She landed on her buttocks and try to break the fall by using her right arm. She has considerable pain in the right shoulder. She is unable to abduct the arm. I am sending her to her [facility] office for x-ray of the right shoulder. I have also instructed her to keep her arm in a sling for comfort. Patient will be out of work for the remainder of the week. Start soma and Norco for her pain and discomfort. I have advised her that both medications will make her sleepy and not to use if driving. She verbalized understanding and agreed.

## 2013-04-10 ENCOUNTER — Telehealth: Payer: Self-pay | Admitting: Internal Medicine

## 2013-04-10 ENCOUNTER — Other Ambulatory Visit: Payer: Self-pay | Admitting: Internal Medicine

## 2013-04-10 DIAGNOSIS — F410 Panic disorder [episodic paroxysmal anxiety] without agoraphobia: Secondary | ICD-10-CM

## 2013-04-10 MED ORDER — ALPRAZOLAM 0.5 MG PO TABS: 0.5000 mg | ORAL_TABLET | Freq: Every evening | ORAL | Status: DC | PRN

## 2013-04-10 MED ORDER — DIAZEPAM 5 MG PO TABS: 5.0000 mg | ORAL_TABLET | Freq: Every day | ORAL | Status: DC | PRN

## 2013-04-10 NOTE — Telephone Encounter (Signed)
Her mother was [employer]. Ok to refll alprazolam #30 and valium #30 no refills

## 2013-04-10 NOTE — Telephone Encounter (Signed)
Patient called stating she has lost her mother in the middle of the night, she is wondering if we can have the valium and xanax refilled. Please advise, if so please sent to [facility].

## 2013-04-10 NOTE — Telephone Encounter (Signed)
Phoned in.

## 2013-05-08 ENCOUNTER — Other Ambulatory Visit: Payer: Self-pay | Admitting: Internal Medicine

## 2013-05-28 ENCOUNTER — Other Ambulatory Visit: Payer: Self-pay | Admitting: Internal Medicine

## 2013-05-29 NOTE — Telephone Encounter (Signed)
Okay to refill? 

## 2013-05-30 NOTE — Telephone Encounter (Signed)
Refill has been denied for valium.  She has not been seen in one year and has dnka'd appts.

## 2013-06-14 ENCOUNTER — Other Ambulatory Visit: Payer: Self-pay | Admitting: Internal Medicine

## 2013-07-31 ENCOUNTER — Encounter: Payer: Self-pay | Admitting: Adult Health

## 2013-07-31 ENCOUNTER — Ambulatory Visit (INDEPENDENT_AMBULATORY_CARE_PROVIDER_SITE_OTHER): Payer: PRIVATE HEALTH INSURANCE | Admitting: Adult Health

## 2013-07-31 VITALS — BP 120/78 | HR 86 | Resp 12 | Wt 137.5 lb

## 2013-07-31 DIAGNOSIS — F329 Major depressive disorder, single episode, unspecified: Secondary | ICD-10-CM

## 2013-07-31 MED ORDER — CITALOPRAM HYDROBROMIDE 40 MG PO TABS: 40.0000 mg | ORAL_TABLET | Freq: Every day | ORAL | Status: DC

## 2013-07-31 NOTE — Patient Instructions (Addendum)
  Start the Celexa 40 mg daily.  Please schedule an appointment with Dr. Darrick Huntsman for the last week of October.  Please call prior if you have any concerns.

## 2013-07-31 NOTE — Progress Notes (Signed)
  Subjective:    Patient ID: Ann Baxter, female    DOB: 1961-07-13, 52 y.o.   MRN: [id number]  HPI  Pt's mother died in 04/17/2023 and pt began drinking heavily. Pt reports history of alcohol abuse and had been sober for over a year.  Pt states she has not had a drink since mid August.  Pt states she has been taking Celexa for several years. Pt states her prescription ran out 3 weeks ago and she decided she wanted to try stopping it since she was able to stop drinking on her own.  Pt stopped taking it abruptly.  Pt is now having severe headaches, depression, and dizziness.  Pt is agreeable to starting Celexa again.   Current Outpatient Prescriptions on File Prior to Visit  Medication Sig Dispense Refill  . ALPRAZolam (XANAX) 0.5 MG tablet Take 1 tablet (0.5 mg total) by mouth at bedtime as needed (panic attack).  30 tablet  3  . amLODipine (NORVASC) 5 MG tablet TAKE 1 TABLET BY MOUTH ONCE A DAY  30 tablet  11  . cetirizine (ZYRTEC) 10 MG tablet Take 10 mg by mouth daily.        Marland Kitchen omeprazole (PRILOSEC) 40 MG capsule TAKE ONE CAPSULE BY MOUTH EVERY DAY  30 capsule  1   No current facility-administered medications on file prior to visit.     Review of Systems  Constitutional: Negative.   Neurological: Positive for dizziness and headaches.  Psychiatric/Behavioral: Negative for suicidal ideas. The patient is nervous/anxious.        Depression       Objective:   Physical Exam  Constitutional: She is oriented to person, place, and time. She appears well-developed and well-nourished.  Cardiovascular: Normal rate, regular rhythm and normal heart sounds.   Pulmonary/Chest: Effort normal and breath sounds normal.  Neurological: She is alert and oriented to person, place, and time.  Skin: Skin is warm and dry.  Psychiatric: Her speech is normal and behavior is normal. Judgment and thought content normal. Her mood appears anxious. Cognition and memory are normal. She exhibits a depressed mood.     BP 120/78  Pulse 86  Resp 12  Wt 137 lb 8 oz (62.37 kg)  BMI 25.14 kg/m2  SpO2 95%       Assessment & Plan:

## 2013-08-02 NOTE — Assessment & Plan Note (Addendum)
Patient is very emotional and crying during the visit. Patient stopped taking Celexa abruptly approximately 3 weeks ago. She has been experiencing withdrawal symptoms including headache, dizziness, flulike symptoms. Patient would like to restart Celexa. She is feeling depressed over the sudden death of her mother this summer. As mentioned in history of present illness, patient had started to drink heavily. She was treating her depression with excessive alcohol. She is sober since August. Allowed time for her to discuss difficulties she was experiencing surrounding the death of her mother. She also reported that it was 2 years following the death of her father. She is appreciative of the time spent and she feels improvement since no longer drinking. Side effects from Celexa withdrawal are still ongoing. Followup appointment with Dr. Darrick Huntsman in 2-4 weeks or sooner if necessary.

## 2013-08-19 LAB — CBC AND DIFFERENTIAL
HCT: 44 % (ref 36–46)
Hemoglobin: 15.4 g/dL (ref 12.0–16.0)
Neutrophils Absolute: 10 /uL

## 2013-08-19 LAB — LIPID PANEL
Cholesterol: 210 mg/dL — AB (ref 0–200)
HDL: 30 mg/dL — AB (ref 35–70)
Triglycerides: 385 mg/dL — AB (ref 40–160)

## 2013-08-19 LAB — BASIC METABOLIC PANEL
Creatinine: 0.7 mg/dL (ref 0.5–1.1)
Glucose: 102 mg/dL
Potassium: 4.1 mmol/L (ref 3.4–5.3)
Sodium: 144 mmol/L (ref 137–147)

## 2013-08-19 LAB — TSH: TSH: 1.94 u[IU]/mL (ref 0.41–5.90)

## 2013-08-19 LAB — HEPATIC FUNCTION PANEL: AST: 18 U/L (ref 13–35)

## 2013-08-25 ENCOUNTER — Encounter: Payer: Self-pay | Admitting: *Deleted

## 2013-08-26 ENCOUNTER — Encounter: Payer: Self-pay | Admitting: Internal Medicine

## 2013-08-26 ENCOUNTER — Ambulatory Visit (INDEPENDENT_AMBULATORY_CARE_PROVIDER_SITE_OTHER): Payer: PRIVATE HEALTH INSURANCE | Admitting: Internal Medicine

## 2013-08-26 VITALS — BP 112/66 | HR 94 | Temp 99.1°F | Resp 12 | Wt 138.0 lb

## 2013-08-26 DIAGNOSIS — Z23 Encounter for immunization: Secondary | ICD-10-CM

## 2013-08-26 DIAGNOSIS — E785 Hyperlipidemia, unspecified: Secondary | ICD-10-CM

## 2013-08-26 DIAGNOSIS — K219 Gastro-esophageal reflux disease without esophagitis: Secondary | ICD-10-CM

## 2013-08-26 DIAGNOSIS — F329 Major depressive disorder, single episode, unspecified: Secondary | ICD-10-CM

## 2013-08-26 MED ORDER — CLONAZEPAM 0.5 MG PO TABS: 0.5000 mg | ORAL_TABLET | Freq: Two times a day (BID) | ORAL | Status: DC | PRN

## 2013-08-26 MED ORDER — PAROXETINE HCL ER 25 MG PO TB24: 25.0000 mg | ORAL_TABLET | ORAL | Status: DC

## 2013-08-26 NOTE — Patient Instructions (Addendum)
We are transitioning you from celexa to Paxil XR 25 mg daily because it is better for anxiety .  Start it tonight.  Reduce your dose of celexa to 20 mg for the next 3 days ,  Then stop it completely  Clonazepam once daily as needed for anxiety (not a panic attack). You can still use the alprazolam for a full blown panic attack.    Return in two weeks    Your cholesterol will improve with diet  (Low carb ice cream) and exercise  We will repeat your white count in a few weeks   Eat low glycemic index

## 2013-08-26 NOTE — Progress Notes (Signed)
Patient ID: Ann Baxter, female   DOB: Jan 11, 1961, 52 y.o.   MRN: [id number]   Patient Active Problem List   Diagnosis Date Noted  . Other and unspecified hyperlipidemia 08/26/2013  . Fall at home 01/12/2013  . Rhinitis, allergic 02/01/2012  . Annual physical exam 02/01/2012  . Tobacco abuse counseling 09/25/2011  . Depressive state 09/25/2011  . Allergy   . GERD (gastroesophageal reflux disease)   . Bronchitis, chronic   . Tobacco abuse     Subjective:  CC:   Chief Complaint  Patient presents with  . Follow-up    patient has concerns about celexa she does not think it is strong enough    HPI:   Ann Baxter a 52 y.o. female who presents for follow up on Complicated Grief reaction following the unexpected death of mother on May 10, 2023. Patient had a complicated relationship with mother, loved her strongly but they fought often.  On the night before she died patient was drinking heavily and did not return mother's phone call.   She has been having a difficult time . On August 13th she had a "meltdown."  Started drinking heavily. ,  Now 11 weeks sober.  Ran out of celexa and did not refill, and suffered a withdrawal syndrome for her SSRI.  She has since resumed it but remains quite bereaved, and has had crying spells daily for the past week. "I'm really missing Mom." She has been to therapy ,  Working on a relationship with God.  Cannot afford formal counselling.    She is having panic attacks frequently ,  Occurring daily at the first sign of stress.  Worried she is going to end up like Mom.  Using alprazolam sparingly.    Past trials of effexor caused insomnia .  Has not tried paxil.     Stomach pain,  Off of PPIs  Back pain.  Has a history of  right sided rib fractures,  Has occasional episodes of stabbing pain in the RUQ and in the back but does not wrap around,  Worse for the past 2 weeks.  Had a really vigoruous massage for 90 minutes last month while at the beach,    Worried that it aggravated her pain and that her  right sided pain is from her liver    Past Medical History  Diagnosis Date  . Allergy   . Bronchitis, chronic   . Tobacco abuse   . GERD (gastroesophageal reflux disease)     H Pylori Positive, treated July 2011    Past Surgical History  Procedure Laterality Date  . Cesarean section  1986       The following portions of the patient's history were reviewed and updated as appropriate: Allergies, current medications, and problem list.    Review of Systems:   12 Pt  review of systems was negative except those addressed in the HPI,     History   Social History  . Marital Status: Married    Spouse Name: N/A    Number of Children: N/A  . Years of Education: N/A   Occupational History  . Not on file.   Social History Main Topics  . Smoking status: Current Every Day Smoker -- 1.00 packs/day for 35 years    Types: Cigarettes  . Smokeless tobacco: Never Used  . Alcohol Use: 3.5 oz/week    7 drink(s) per week  . Drug Use: No  . Sexual Activity: Not Currently    Birth  Control/ Protection: Abstinence   Other Topics Concern  . Not on file   Social History Narrative  . No narrative on file    Objective:  Filed Vitals:   08/26/13 1556  BP: 112/66  Pulse: 94  Temp: 99.1 F (37.3 C)  Resp: 12     General appearance: tearful, depressed a;ert, cooperative and appears stated age Ears: normal TM's and external ear canals both ears Throat: lips, mucosa, and tongue normal; teeth and gums normal Neck: no adenopathy, no carotid bruit, supple, symmetrical, trachea midline and thyroid not enlarged, symmetric, no tenderness/mass/nodules Back: symmetric, no curvature. ROM normal. No CVA tenderness. Lungs: clear to auscultation bilaterally Heart: regular rate and rhythm, S1, S2 normal, no murmur, click, rub or gallop Abdomen: soft, non-tender; bowel sounds normal; no masses,  no organomegaly Pulses: 2+ and  symmetric Skin: Skin color, texture, turgor normal. No rashes or lesions Lymph nodes: Cervical, supraclavicular, and axillary nodes normal. Psych:  Makes good eye contact.,  Speech not pressured.    Assessment and Plan:  Depressive state Complicated by grief and uncontrolled anxiety with frequent panic attacks described despite daily use of citalopram 40 mg and prn use of alprazolam . Discussed transition to paxil and clonazepam, patient accepts trial.,  Return in 2 weeks   GERD (gastroesophageal reflux disease) With gastritis.  Resume PPI with protonix.   Other and unspecified hyperlipidemia Outside labs reviewed.  Triglycerides mildly elevated and HDL low. .  Discussed low GI diet and exercise,  Repeat  In 3 months   Lab Results  Component Value Date   CHOL 210* 08/19/2013   HDL 30* 08/19/2013   LDLCALC 103 08/19/2013   TRIG 385* 08/19/2013    A total of 40 minutes was spent with patient more than half of which was spent in counseling, reviewing records from outside lab.   Updated Medication List Outpatient Encounter Prescriptions as of 08/26/2013  Medication Sig  . ALPRAZolam (XANAX) 0.5 MG tablet Take 1 tablet (0.5 mg total) by mouth at bedtime as needed (panic attack).  Marland Kitchen amLODipine (NORVASC) 5 MG tablet TAKE 1 TABLET BY MOUTH ONCE A DAY  . Biotin 5000 MCG CAPS Take 1 capsule by mouth daily.  . cetirizine (ZYRTEC) 10 MG tablet Take 10 mg by mouth daily.    . Multiple Vitamins-Minerals (WOMENS MULTIVITAMIN PLUS PO) Take by mouth.  . [DISCONTINUED] citalopram (CELEXA) 40 MG tablet Take 1 tablet (40 mg total) by mouth daily.  . [DISCONTINUED] diazepam (VALIUM) 5 MG tablet Take 5 mg by mouth every 12 (twelve) hours as needed for anxiety.  . [DISCONTINUED] omeprazole (PRILOSEC) 40 MG capsule TAKE ONE CAPSULE BY MOUTH EVERY DAY  . clonazePAM (KLONOPIN) 0.5 MG tablet Take 1 tablet (0.5 mg total) by mouth 2 (two) times daily as needed for anxiety.  Marland Kitchen PARoxetine (PAXIL-CR) 25 MG 24  hr tablet Take 1 tablet (25 mg total) by mouth every morning.     Orders Placed This Encounter  Procedures  . Tdap vaccine greater than or equal to 7yo IM  . CBC and differential  . Basic metabolic panel  . Lipid panel  . Hepatic function panel  . Hemoglobin A1c  . TSH    No Follow-up on file.

## 2013-08-27 ENCOUNTER — Telehealth: Payer: Self-pay | Admitting: Internal Medicine

## 2013-08-27 ENCOUNTER — Other Ambulatory Visit: Payer: Self-pay | Admitting: Internal Medicine

## 2013-08-27 MED ORDER — PANTOPRAZOLE SODIUM 40 MG PO TBEC: 40.0000 mg | DELAYED_RELEASE_TABLET | Freq: Every day | ORAL | Status: DC

## 2013-08-27 NOTE — Telephone Encounter (Signed)
Patient stated you mentioned calling in something stronger than omeprazole for stomach, during Visit 08/26/13 I did not see anything in notes.

## 2013-08-27 NOTE — Telephone Encounter (Signed)
States she was in for an appointment yesterday and discussed a new medication for her stomach.  She was on omeprazole but requested something stronger.  Pt states this was not called in, and is not sure what the name of the new medication is.  [facility]

## 2013-08-27 NOTE — Telephone Encounter (Signed)
protonix sent

## 2013-08-28 NOTE — Telephone Encounter (Signed)
Pt.notified

## 2013-08-29 ENCOUNTER — Encounter: Payer: Self-pay | Admitting: Internal Medicine

## 2013-08-29 NOTE — Assessment & Plan Note (Addendum)
Outside labs reviewed.  Triglycerides mildly elevated and HDL low. .  Discussed low GI diet and exercise,  Repeat  In 3 months   Lab Results  Component Value Date   CHOL 210* 08/19/2013   HDL 30* 08/19/2013   LDLCALC 103 08/19/2013   TRIG 385* 08/19/2013

## 2013-08-29 NOTE — Assessment & Plan Note (Signed)
With gastritis.  Resume PPI with protonix.

## 2013-08-29 NOTE — Assessment & Plan Note (Signed)
Complicated by grief and uncontrolled anxiety with frequent panic attacks described despite daily use of citalopram 40 mg and prn use of alprazolam . Discussed transition to paxil and clonazepam, patient accepts trial.,  Return in 2 weeks

## 2013-09-09 ENCOUNTER — Ambulatory Visit (INDEPENDENT_AMBULATORY_CARE_PROVIDER_SITE_OTHER): Payer: PRIVATE HEALTH INSURANCE | Admitting: Internal Medicine

## 2013-09-09 ENCOUNTER — Encounter: Payer: Self-pay | Admitting: Internal Medicine

## 2013-09-09 VITALS — BP 126/72 | HR 90 | Temp 99.2°F | Resp 12 | Ht 62.0 in | Wt 137.5 lb

## 2013-09-09 DIAGNOSIS — K219 Gastro-esophageal reflux disease without esophagitis: Secondary | ICD-10-CM

## 2013-09-09 DIAGNOSIS — F329 Major depressive disorder, single episode, unspecified: Secondary | ICD-10-CM

## 2013-09-09 NOTE — Progress Notes (Signed)
Pre-visit discussion using our clinic review tool. No additional management support is needed unless otherwise documented below in the visit note.  

## 2013-09-09 NOTE — Progress Notes (Signed)
Patient ID: Ann Baxter, female   DOB: 1961-08-03, 52 y.o.   MRN: [id number]   Patient Active Problem List   Diagnosis Date Noted  . Other and unspecified hyperlipidemia 08/26/2013  . Fall at home 01/12/2013  . Rhinitis, allergic 02/01/2012  . Annual physical exam 02/01/2012  . Tobacco abuse counseling 09/25/2011  . Depressive state 09/25/2011  . Allergy   . GERD (gastroesophageal reflux disease)   . Bronchitis, chronic   . Tobacco abuse     Subjective:  CC:   Chief Complaint  Patient presents with  . Follow-up    2  week    HPI:   Ann Baxter a 52 y.o. female who presents for a 3 week follow up on complicated grief reaction with uncontrolled anxiety.  Medication changes were made: celexa was changed to paxil, and alprazolam was changed to clonazepam.  Gastritis:  omeprazole was changed to protonix  She feels great.  Anxiety is under control,  Using clonazepam as needed and not daily.   Gastritis under control with protonoxi,  But aggravted by tomato based soups so she is avoiding them.   hypertriglceirdema and abnormal glucose:  Following the low glycemic index diet  Right wrist pain :  Seeing Reita Chard tomorrow for revaluation of pain in wrist ulnar side radiates to elbow.    Past Medical History  Diagnosis Date  . Allergy   . Bronchitis, chronic   . Tobacco abuse   . GERD (gastroesophageal reflux disease)     H Pylori Positive, treated July 2011    Past Surgical History  Procedure Laterality Date  . Cesarean section  1986       The following portions of the patient's history were reviewed and updated as appropriate: Allergies, current medications, and problem list.    Review of Systems:   12 Pt  review of systems was negative except those addressed in the HPI,     History   Social History  . Marital Status: Married    Spouse Name: N/A    Number of Children: N/A  . Years of Education: N/A   Occupational History  . Not on file.    Social History Main Topics  . Smoking status: Current Every Day Smoker -- 1.00 packs/day for 35 years    Types: Cigarettes  . Smokeless tobacco: Never Used  . Alcohol Use: 3.5 oz/week    7 drink(s) per week  . Drug Use: No  . Sexual Activity: Not Currently    Birth Control/ Protection: Abstinence   Other Topics Concern  . Not on file   Social History Narrative  . No narrative on file    Objective:  Filed Vitals:   09/09/13 1537  BP: 126/72  Pulse: 90  Temp: 99.2 F (37.3 C)  Resp: 12     General appearance: alert, cooperative and appears stated age Ears: normal TM's and external ear canals both ears Throat: lips, mucosa, and tongue normal; teeth and gums normal Neck: no adenopathy, no carotid bruit, supple, symmetrical, trachea midline and thyroid not enlarged, symmetric, no tenderness/mass/nodules Back: symmetric, no curvature. ROM normal. No CVA tenderness. Lungs: clear to auscultation bilaterally Heart: regular rate and rhythm, S1, S2 normal, no murmur, click, rub or gallop Abdomen: soft, non-tender; bowel sounds normal; no masses,  no organomegaly Pulses: 2+ and symmetric Skin: Skin color, texture, turgor normal. No rashes or lesions Lymph nodes: Cervical, supraclavicular, and axillary nodes normal.  Assessment and Plan:  Depressive state Improved with  paxil cr, and alprazolam prn,.  No changes today  GERD (gastroesophageal reflux disease) With recent episode of gastritis resolveld with protonix use .  No changes today continue for 3 months    Updated Medication List Outpatient Encounter Prescriptions as of 09/09/2013  Medication Sig  . ALPRAZolam (XANAX) 0.5 MG tablet Take 1 tablet (0.5 mg total) by mouth at bedtime as needed (panic attack).  . Biotin 5000 MCG CAPS Take 1 capsule by mouth daily.  . cetirizine (ZYRTEC) 10 MG tablet Take 10 mg by mouth daily.    . clonazePAM (KLONOPIN) 0.5 MG tablet Take 1 tablet (0.5 mg total) by mouth 2 (two) times  daily as needed for anxiety.  . Multiple Vitamins-Minerals (WOMENS MULTIVITAMIN PLUS PO) Take by mouth.  . pantoprazole (PROTONIX) 40 MG tablet Take 1 tablet (40 mg total) by mouth daily. In the AM 30 minutes prior to food  . PARoxetine (PAXIL-CR) 25 MG 24 hr tablet Take 1 tablet (25 mg total) by mouth every morning.  . [DISCONTINUED] amLODipine (NORVASC) 5 MG tablet TAKE 1 TABLET BY MOUTH ONCE A DAY     No orders of the defined types were placed in this encounter.    No Follow-up on file.

## 2013-09-11 ENCOUNTER — Other Ambulatory Visit: Payer: Self-pay | Admitting: Internal Medicine

## 2013-09-11 NOTE — Assessment & Plan Note (Signed)
Improved with paxil cr, and alprazolam prn,.  No changes today

## 2013-09-11 NOTE — Assessment & Plan Note (Signed)
With recent episode of gastritis resolveld with protonix use .  No changes today continue for 3 months

## 2013-10-23 ENCOUNTER — Other Ambulatory Visit: Payer: Self-pay | Admitting: Internal Medicine

## 2013-10-23 MED ORDER — PAROXETINE HCL ER 25 MG PO TB24: 25.0000 mg | ORAL_TABLET | ORAL | Status: DC

## 2013-10-23 NOTE — Telephone Encounter (Signed)
Rx sent to pharmacy   

## 2013-11-28 IMAGING — CR DG CHEST 2V
1 series · 2 of 2 positions shown · non-contrast
Comparison: none

REASON FOR EXAM: FALL, R RIB PAIN
COMMENTS:   May transport without cardiac monitor

[Series 4: w chest pa · 0.14mm/px · 2 of 2 slices shown]
[im 1/2]
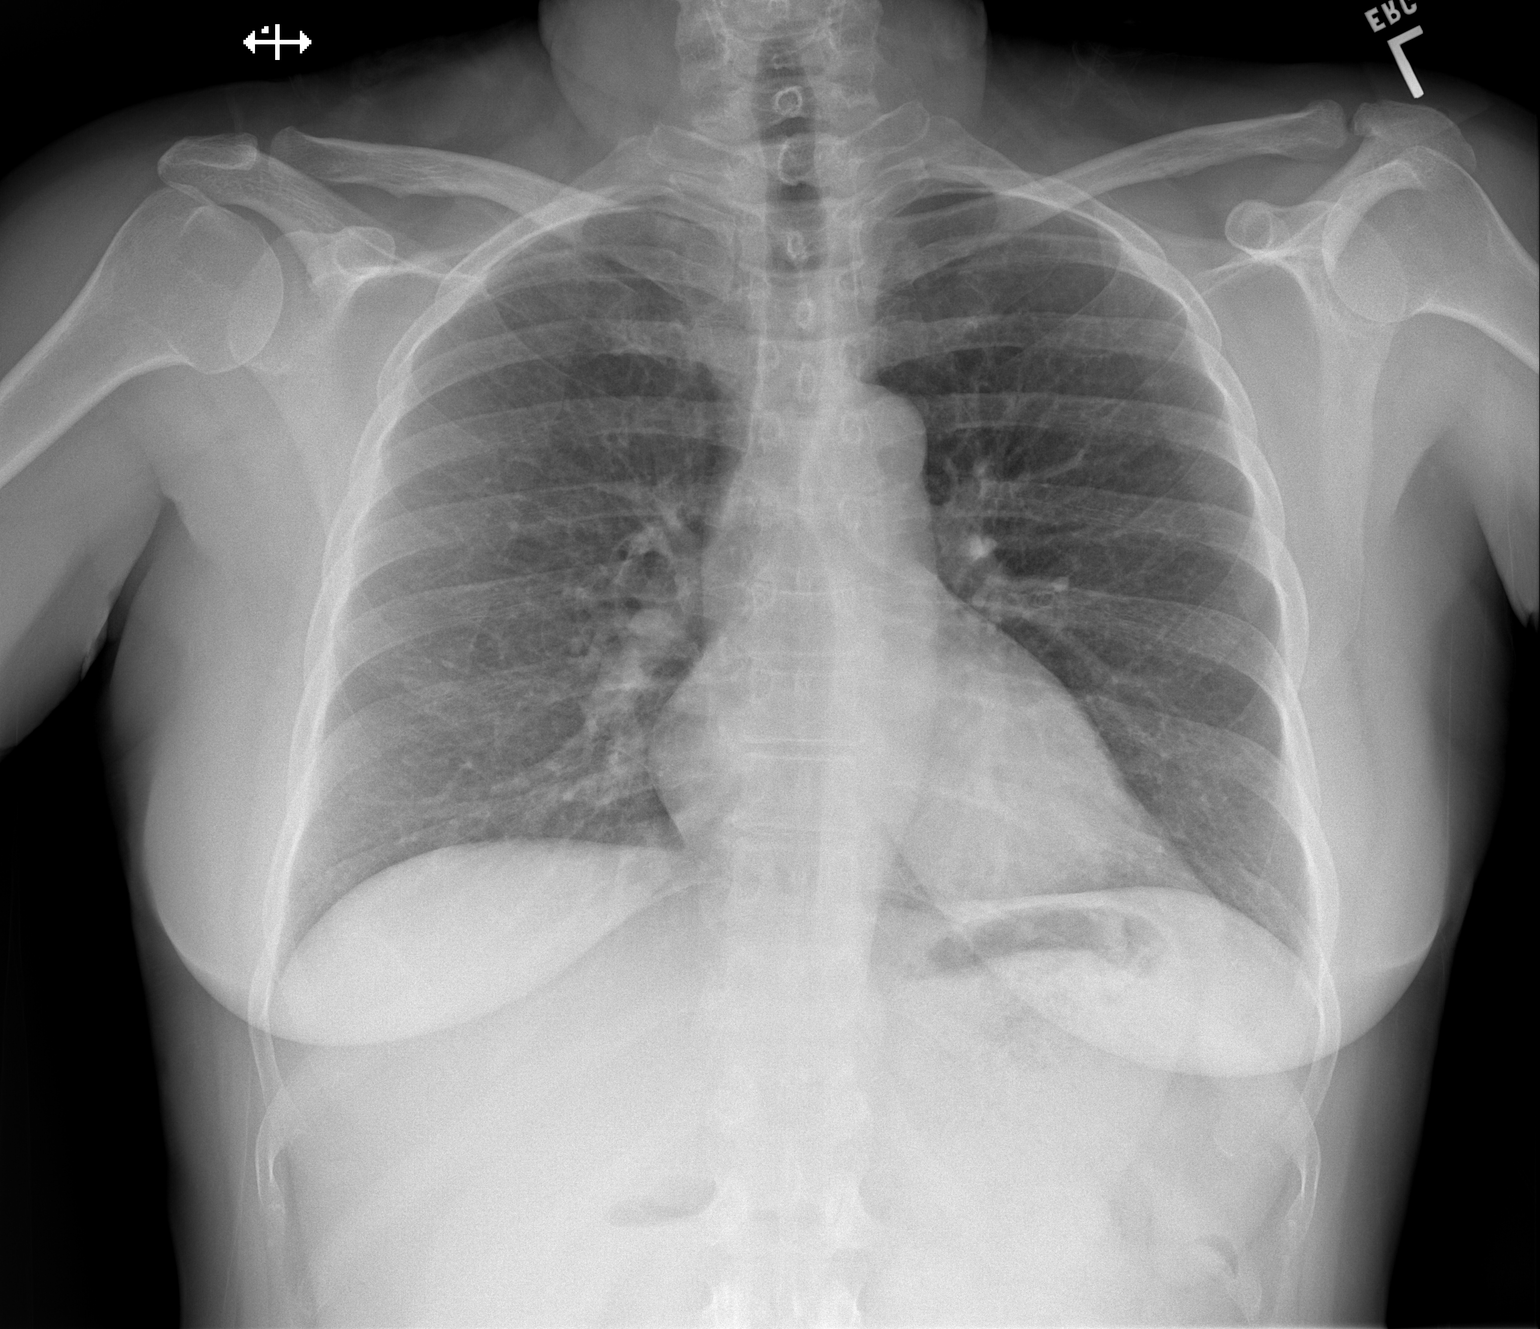
[im 2/2]
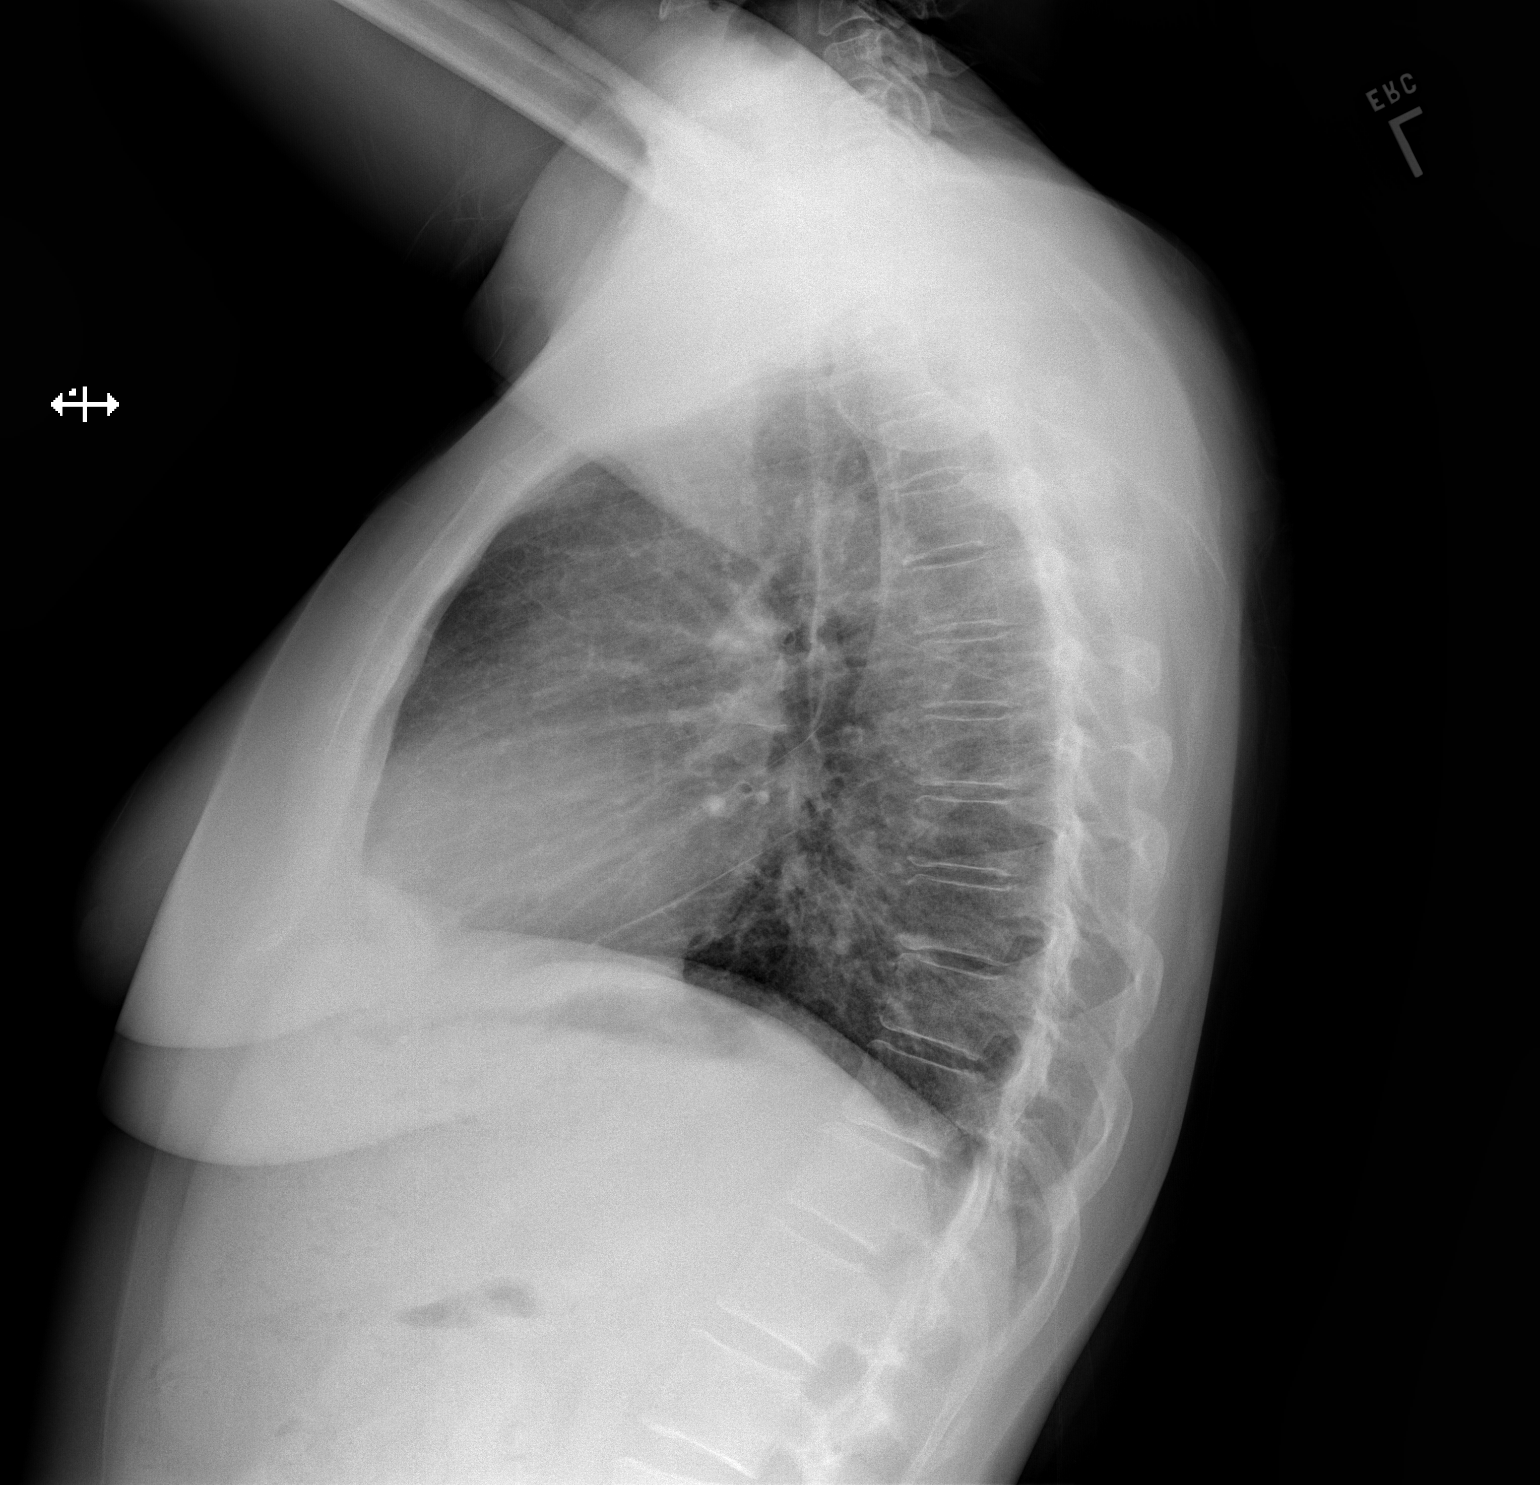

[2 of 2 positions shown; findings below may reference images not displayed]

PROCEDURE:     DXR - DXR CHEST PA (OR AP) AND LATERAL  - April 13, 2012  [DATE]

RESULT:     Comparison is made to study of [id number].

The lungs are well-expanded. There is no pneumothorax or pneumomediastinum
or pleural effusion. The cardiac silhouette is normal in size. The
mediastinum is normal in width. There is no pulmonary vascular congestion.
The bony thorax is normal where visualized.
IMPRESSION: I do not see evidence of acute thoracic injury nor other
acute cardiopulmonary abnormality.

## 2013-11-28 IMAGING — CR DG RIBS 2V*R*
1 series · 3 of 3 positions shown · non-contrast
Comparison: none

REASON FOR EXAM: trauma, pain
COMMENTS:

[Series 1: w ribs ap upper right · 0.14mm/px · 3 of 3 slices shown]
[im 1/3]
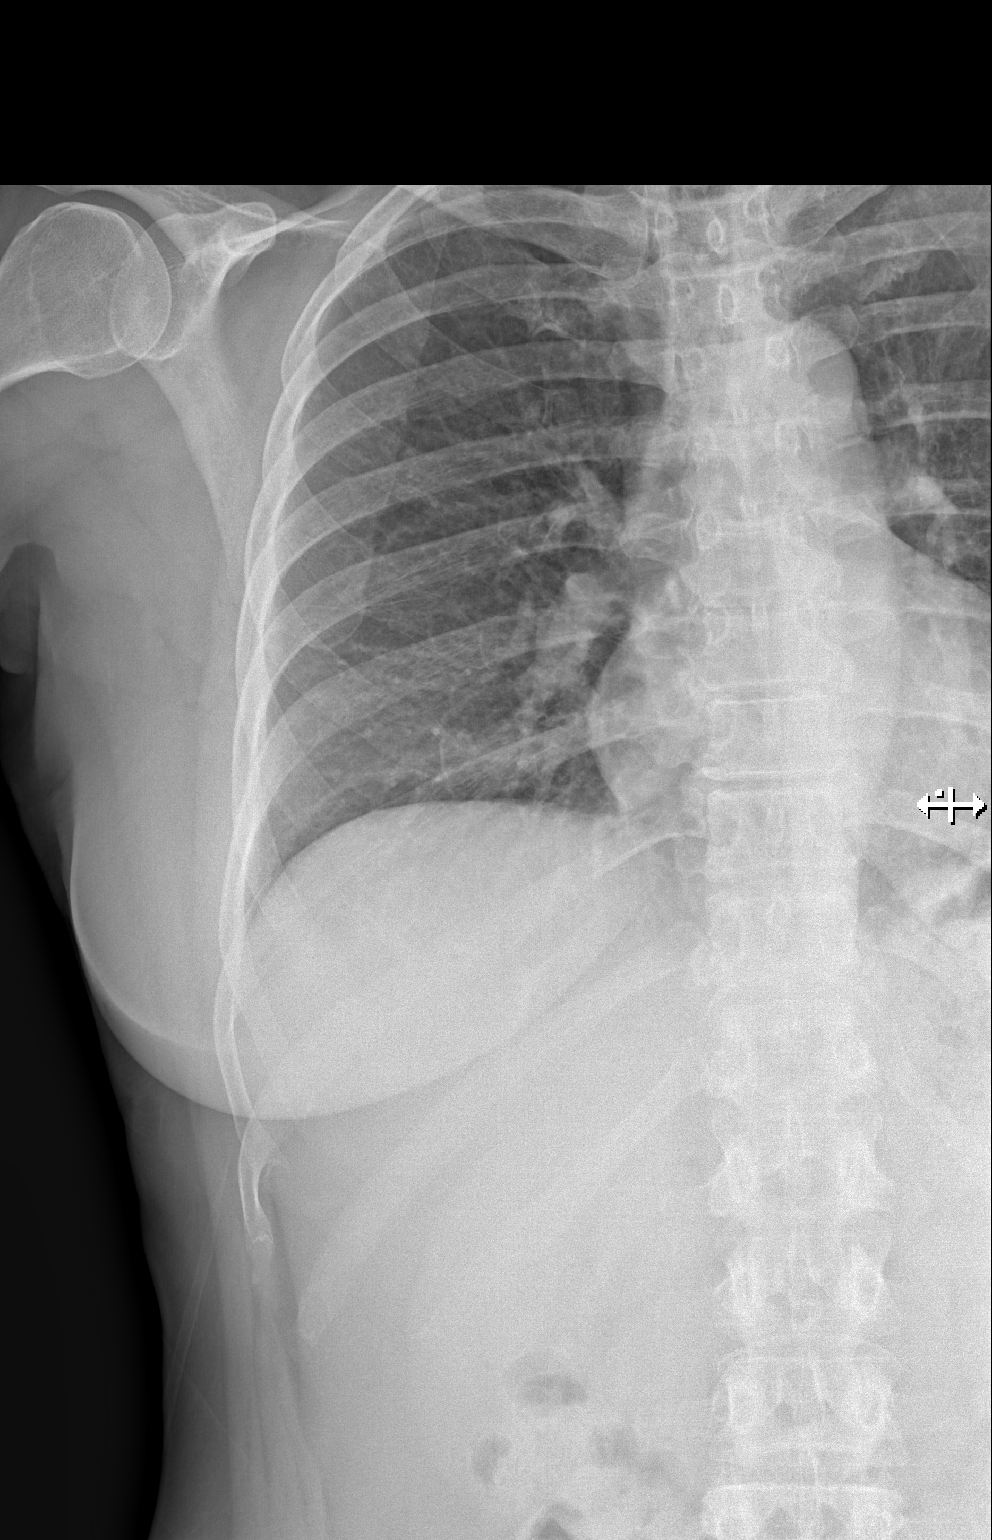
[im 2/3]
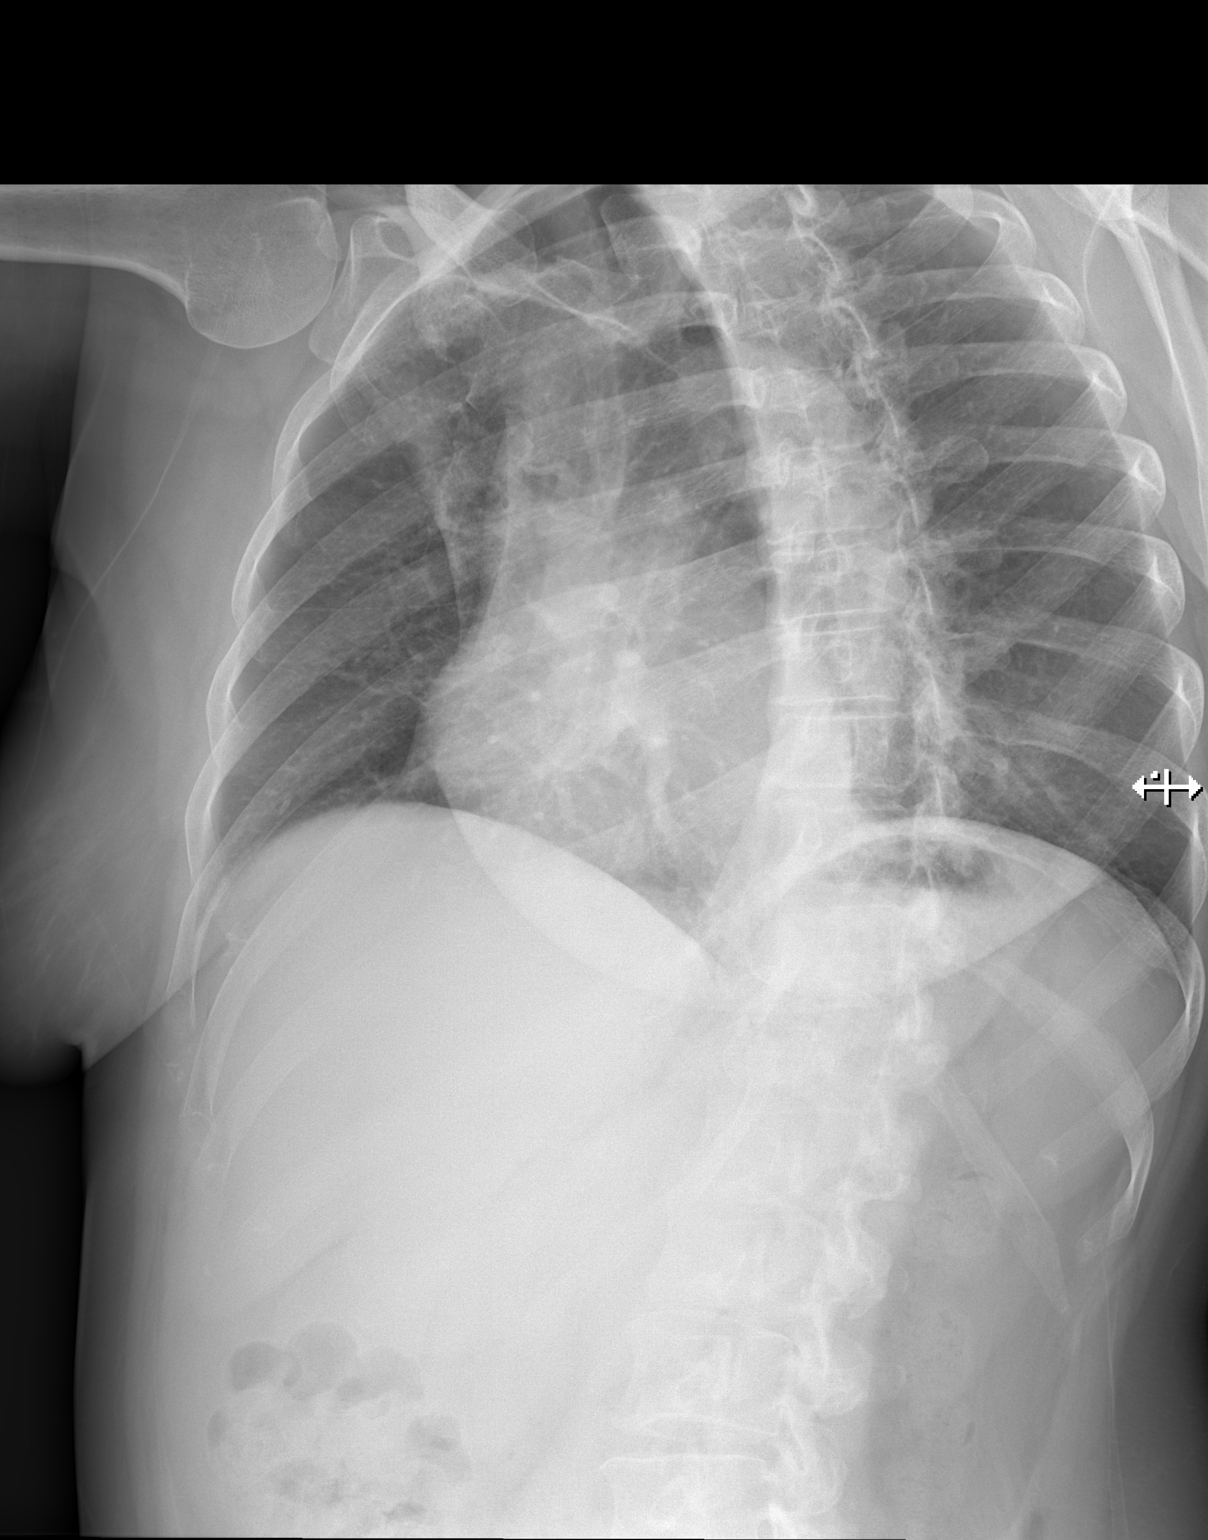
[im 3/3]
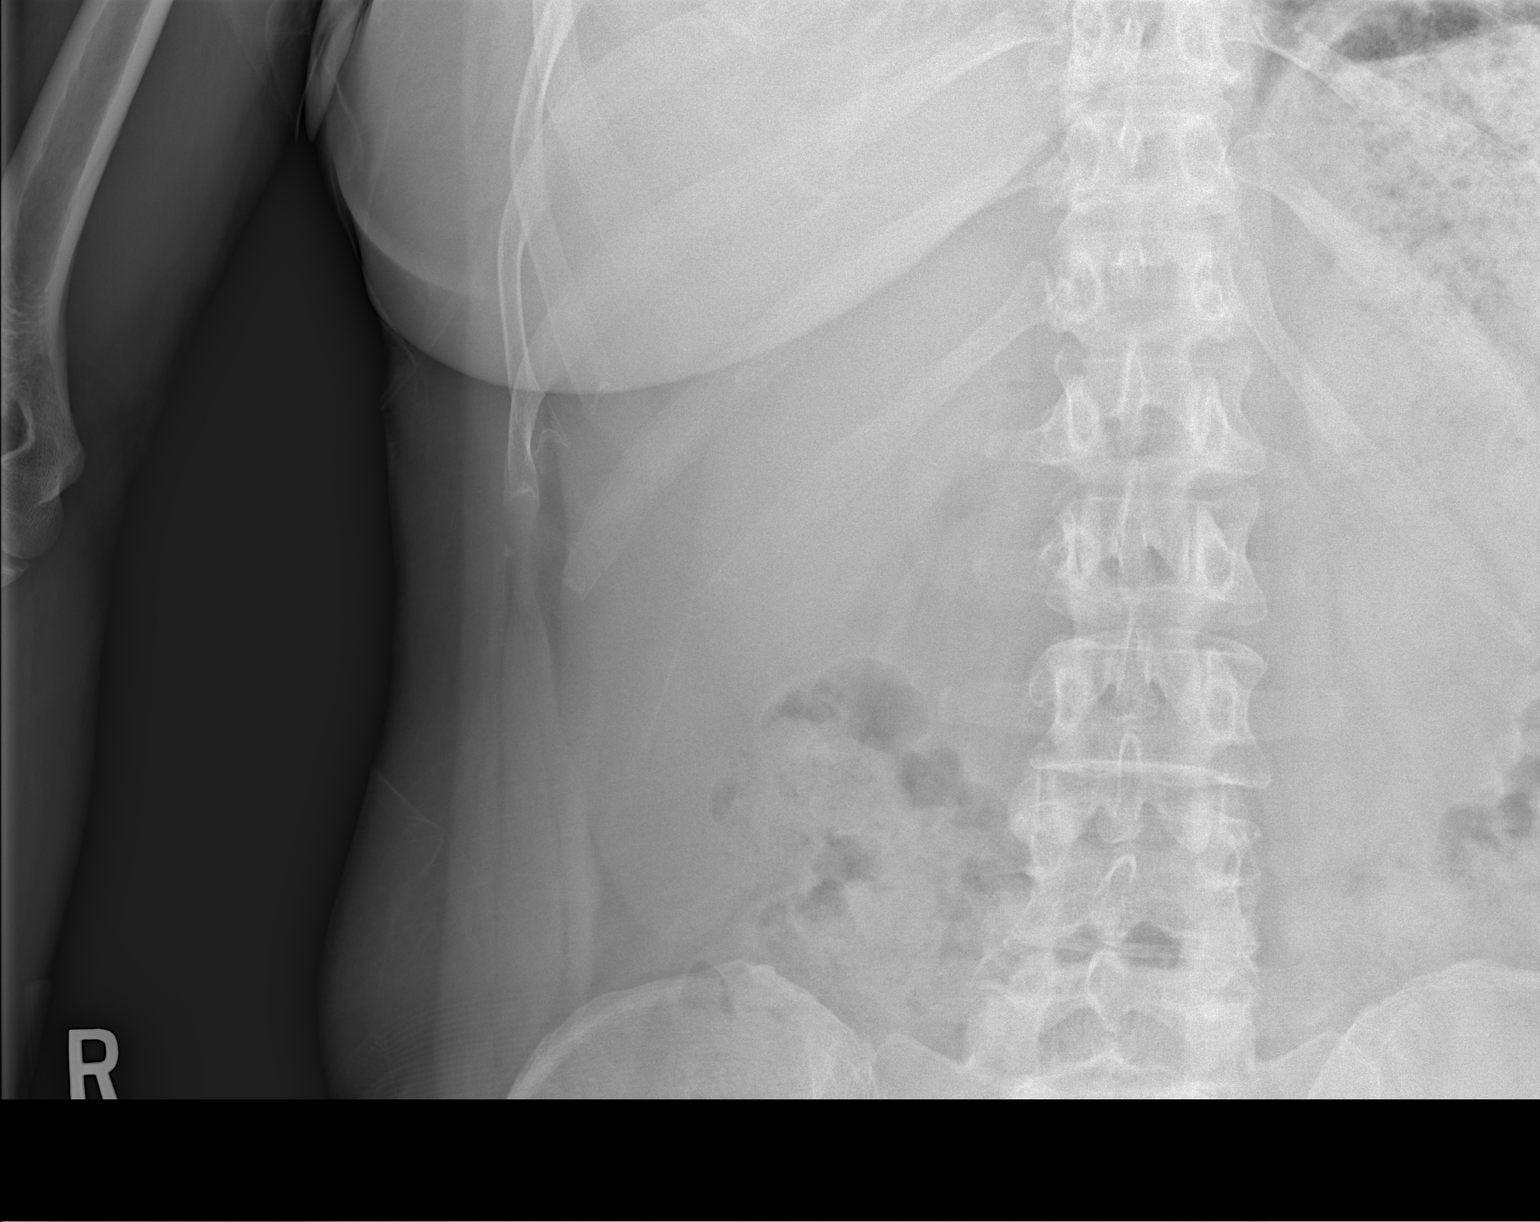

[3 of 3 positions shown; findings below may reference images not displayed]

PROCEDURE:     DXR - DXR RIBS RIGHT UNILATERAL  - April 13, 2012  [DATE]

RESULT:     Three views of the right rib cage are submitted. The bones
appear adequately mineralized. There is subtle lucency seen through the
extreme anterior aspect of the right tenth rib just proximal to the
costochondral junction. This may reflect a fracture.
IMPRESSION: I cannot exclude a nondisplaced fracture through the tip of
the anterior aspect of the right tenth rib.

## 2013-12-09 ENCOUNTER — Encounter: Payer: Self-pay | Admitting: Internal Medicine

## 2013-12-09 MED ORDER — BUSPIRONE HCL 7.5 MG PO TABS: 7.5000 mg | ORAL_TABLET | Freq: Three times a day (TID) | ORAL | Status: DC

## 2013-12-16 ENCOUNTER — Other Ambulatory Visit: Payer: PRIVATE HEALTH INSURANCE

## 2013-12-19 ENCOUNTER — Other Ambulatory Visit: Payer: Self-pay | Admitting: Internal Medicine

## 2013-12-21 ENCOUNTER — Other Ambulatory Visit: Payer: Self-pay | Admitting: *Deleted

## 2013-12-21 ENCOUNTER — Emergency Department: Payer: Self-pay | Admitting: Emergency Medicine

## 2013-12-21 LAB — CBC WITH DIFFERENTIAL/PLATELET
Basophil #: 0 10*3/uL (ref 0.0–0.1)
Basophil %: 0.2 %
EOS PCT: 0.1 %
Eosinophil #: 0 10*3/uL (ref 0.0–0.7)
HCT: 48 % — ABNORMAL HIGH (ref 35.0–47.0)
HGB: 16.4 g/dL — ABNORMAL HIGH (ref 12.0–16.0)
Lymphocyte #: 0.5 10*3/uL — ABNORMAL LOW (ref 1.0–3.6)
Lymphocyte %: 3 %
MCH: 30.1 pg (ref 26.0–34.0)
MCHC: 34.1 g/dL (ref 32.0–36.0)
MCV: 88 fL (ref 80–100)
MONOS PCT: 3.1 %
Monocyte #: 0.5 x10 3/mm (ref 0.2–0.9)
NEUTROS ABS: 16.1 10*3/uL — AB (ref 1.4–6.5)
Neutrophil %: 93.6 %
PLATELETS: 225 10*3/uL (ref 150–440)
RBC: 5.45 10*6/uL — AB (ref 3.80–5.20)
RDW: 13.1 % (ref 11.5–14.5)
WBC: 17.2 10*3/uL — ABNORMAL HIGH (ref 3.6–11.0)

## 2013-12-21 LAB — COMPREHENSIVE METABOLIC PANEL
ALK PHOS: 108 U/L
Albumin: 4.4 g/dL (ref 3.4–5.0)
Anion Gap: 10 (ref 7–16)
BUN: 27 mg/dL — ABNORMAL HIGH (ref 7–18)
Bilirubin,Total: 0.9 mg/dL (ref 0.2–1.0)
CO2: 25 mmol/L (ref 21–32)
Calcium, Total: 10 mg/dL (ref 8.5–10.1)
Chloride: 103 mmol/L (ref 98–107)
Creatinine: 1.21 mg/dL (ref 0.60–1.30)
EGFR (African American): 59 — ABNORMAL LOW
GFR CALC NON AF AMER: 51 — AB
GLUCOSE: 168 mg/dL — AB (ref 65–99)
OSMOLALITY: 285 (ref 275–301)
POTASSIUM: 3.7 mmol/L (ref 3.5–5.1)
SGOT(AST): 14 U/L — ABNORMAL LOW (ref 15–37)
SGPT (ALT): 39 U/L (ref 12–78)
SODIUM: 138 mmol/L (ref 136–145)
Total Protein: 8.5 g/dL — ABNORMAL HIGH (ref 6.4–8.2)

## 2013-12-21 LAB — URINALYSIS, COMPLETE
BACTERIA: NONE SEEN
Bilirubin,UR: NEGATIVE
Blood: NEGATIVE
GLUCOSE, UR: NEGATIVE mg/dL (ref 0–75)
Ketone: NEGATIVE
LEUKOCYTE ESTERASE: NEGATIVE
NITRITE: NEGATIVE
Ph: 5 (ref 4.5–8.0)
Protein: 30
SPECIFIC GRAVITY: 1.024 (ref 1.003–1.030)
Squamous Epithelial: 1
WBC UR: 2 /HPF (ref 0–5)

## 2013-12-21 LAB — LIPASE, BLOOD: Lipase: 240 U/L (ref 73–393)

## 2013-12-21 MED ORDER — PANTOPRAZOLE SODIUM 40 MG PO TBEC: 40.0000 mg | DELAYED_RELEASE_TABLET | Freq: Every day | ORAL | Status: DC

## 2013-12-29 ENCOUNTER — Ambulatory Visit: Payer: Self-pay | Admitting: Internal Medicine

## 2014-01-04 ENCOUNTER — Telehealth: Payer: Self-pay | Admitting: *Deleted

## 2014-01-04 MED ORDER — BUSPIRONE HCL 7.5 MG PO TABS: 7.5000 mg | ORAL_TABLET | Freq: Three times a day (TID) | ORAL | Status: DC

## 2014-01-04 NOTE — Telephone Encounter (Signed)
Patient notified script faxed.

## 2014-01-04 NOTE — Telephone Encounter (Signed)
Yes rx printed,  Where to send ?

## 2014-01-04 NOTE — Telephone Encounter (Signed)
[facility] and has ask if we can fill Buspar she has accidentally threw a way  Last fill was 12/09/12 patient also wanted you to know [facility] seen in ER for dehydration and had blood work.

## 2014-01-07 LAB — HM MAMMOGRAPHY

## 2014-01-22 ENCOUNTER — Encounter: Payer: Self-pay | Admitting: Internal Medicine

## 2014-01-27 ENCOUNTER — Ambulatory Visit: Payer: Self-pay | Admitting: Anesthesiology

## 2014-01-27 DIAGNOSIS — I10 Essential (primary) hypertension: Secondary | ICD-10-CM

## 2014-01-27 DIAGNOSIS — Z01810 Encounter for preprocedural cardiovascular examination: Secondary | ICD-10-CM

## 2014-02-02 ENCOUNTER — Ambulatory Visit: Payer: Self-pay | Admitting: Specialist

## 2014-02-18 ENCOUNTER — Encounter: Payer: Self-pay | Admitting: Internal Medicine

## 2014-02-18 NOTE — Telephone Encounter (Signed)
Patient requesting change in dose of Buspar. Please advise.

## 2014-02-18 NOTE — Telephone Encounter (Signed)
Last visit 09/09/13, see mychart message, ok to refill clonazepam?

## 2014-02-19 ENCOUNTER — Other Ambulatory Visit: Payer: Self-pay | Admitting: Internal Medicine

## 2014-02-19 MED ORDER — CLONAZEPAM 0.5 MG PO TABS: 0.5000 mg | ORAL_TABLET | Freq: Two times a day (BID) | ORAL | Status: DC | PRN

## 2014-02-19 MED ORDER — BUSPIRONE HCL 10 MG PO TABS: 10.0000 mg | ORAL_TABLET | Freq: Three times a day (TID) | ORAL | Status: DC

## 2014-02-19 MED ORDER — CLONAZEPAM 0.5 MG PO TABS
0.5000 mg | ORAL_TABLET | Freq: Two times a day (BID) | ORAL | Status: DC | PRN
Start: ? — End: 2014-02-19

## 2014-03-14 ENCOUNTER — Other Ambulatory Visit: Payer: Self-pay | Admitting: Internal Medicine

## 2014-03-18 ENCOUNTER — Other Ambulatory Visit: Payer: Self-pay | Admitting: Internal Medicine

## 2014-04-12 ENCOUNTER — Other Ambulatory Visit: Payer: Self-pay | Admitting: Internal Medicine

## 2014-04-12 NOTE — Telephone Encounter (Signed)
Pt last OV 11.12.2014, last refill 5.17.15.  Please advise Refill.

## 2014-05-09 ENCOUNTER — Other Ambulatory Visit: Payer: Self-pay | Admitting: Internal Medicine

## 2014-05-10 ENCOUNTER — Other Ambulatory Visit: Payer: Self-pay | Admitting: Internal Medicine

## 2014-06-08 ENCOUNTER — Other Ambulatory Visit: Payer: Self-pay | Admitting: Internal Medicine

## 2014-06-08 NOTE — Telephone Encounter (Signed)
Appt 06/16/14.

## 2014-06-09 ENCOUNTER — Other Ambulatory Visit: Payer: Self-pay | Admitting: Internal Medicine

## 2014-06-09 NOTE — Telephone Encounter (Signed)
Apt 06/16/14

## 2014-06-16 ENCOUNTER — Encounter: Payer: Self-pay | Admitting: *Deleted

## 2014-06-16 ENCOUNTER — Encounter: Payer: Self-pay | Admitting: Internal Medicine

## 2014-06-16 ENCOUNTER — Ambulatory Visit (INDEPENDENT_AMBULATORY_CARE_PROVIDER_SITE_OTHER): Payer: PRIVATE HEALTH INSURANCE | Admitting: Internal Medicine

## 2014-06-16 VITALS — BP 136/84 | HR 102 | Temp 99.0°F | Resp 16 | Ht 62.0 in | Wt 138.0 lb

## 2014-06-16 DIAGNOSIS — F329 Major depressive disorder, single episode, unspecified: Secondary | ICD-10-CM

## 2014-06-16 DIAGNOSIS — Z716 Tobacco abuse counseling: Secondary | ICD-10-CM

## 2014-06-16 DIAGNOSIS — F17200 Nicotine dependence, unspecified, uncomplicated: Secondary | ICD-10-CM

## 2014-06-16 DIAGNOSIS — F32A Depression, unspecified: Secondary | ICD-10-CM

## 2014-06-16 DIAGNOSIS — F3289 Other specified depressive episodes: Secondary | ICD-10-CM

## 2014-06-16 DIAGNOSIS — Z7189 Other specified counseling: Secondary | ICD-10-CM

## 2014-06-16 MED ORDER — DULOXETINE HCL 20 MG PO CPEP: 20.0000 mg | ORAL_CAPSULE | Freq: Two times a day (BID) | ORAL | Status: DC

## 2014-06-16 NOTE — Patient Instructions (Signed)
Stop the buspirone  Start generic cymbalta twice daily ,  This is an antidepressant that also helps managed chronic pain  Use the clonazepam sparingly  To treat anxiety  Make sure you get the labs done in October

## 2014-06-16 NOTE — Progress Notes (Signed)
Patient ID: Ann Baxter, female   DOB: 11/07/1960, 53 y.o.   MRN: [id number]   Patient Active Problem List   Diagnosis Date Noted  . Other and unspecified hyperlipidemia 08/26/2013  . Fall at home 01/12/2013  . Annual physical exam 02/01/2012  . Tobacco abuse counseling 09/25/2011  . Depressive state 09/25/2011  . Allergy   . GERD (gastroesophageal reflux disease)   . Tobacco abuse     Subjective:  CC:   Chief Complaint  Patient presents with  . Follow-up    medication refills  . Hand Pain    Carpal tunnel    HPI:   Ann Baxter is a 53 y.o. female who presents for Follow up on depression,   Hyperlipidemia and CTS   She had CT release on  April 7th by Reita Chard,  Despite my recommendations to see a hand surgeon.  She feels that her procedure was "botched " and went to see a hand surgeon yesterday,  EMG still positive  On the right and still has numbness and weakness and swelling .  Has been out of work since Feb,  18 wks since surgery   She is not sleeping due to hand pain .  The hand surgeon did not give her a narcotic, but  She was prescribed  neurontin by Katrinka Blazing 300 mg twice daily ,  Which was Increased to tid by hand surgeon  Follow up on depression,   Hyperlipidemia and CTS   She had CT release on  April 7th by Reita Chard,  Despite my recommendations to see a hand surgeon.  She feels that her procedure was "botched " and went to see a hand surgeon yesterday,  EMG still positive  On the right and still has numbness and weakness and swelling .  Has been out of work since Feb,  18 wks since surgery   She is not sleeping due to hand pain .  The hand surgeon did not give her a narcotic, but  She was prescribed  neurontin by Katrinka Blazing 300 mg twice daily ,  Which was Increased to tid by hand surgeon    Past Medical History  Diagnosis Date  . Allergy   . Bronchitis, chronic   . Tobacco abuse   . GERD (gastroesophageal reflux disease)     H Pylori Positive, treated July 2011     Past Surgical History  Procedure Laterality Date  . Cesarean section  1986       The following portions of the patient's history were reviewed and updated as appropriate: Allergies, current medications, and problem list.    Review of Systems:   Patient denies headache, fevers, malaise, unintentional weight loss, skin rash, eye pain, sinus congestion and sinus pain, sore throat, dysphagia,  hemoptysis , cough, dyspnea, wheezing, chest pain, palpitations, orthopnea, edema, abdominal pain, nausea, melena, diarrhea, constipation, flank pain, dysuria, hematuria, urinary  Frequency, nocturia, numbness, tingling, seizures,  Focal weakness, Loss of consciousness,  Tremor, insomnia, depression, anxiety, and suicidal ideation.     History   Social History  . Marital Status: Married    Spouse Name: N/A    Number of Children: N/A  . Years of Education: N/A   Occupational History  . Not on file.   Social History Main Topics  . Smoking status: Current Every Day Smoker -- 1.00 packs/day for 35 years    Types: Cigarettes  . Smokeless tobacco: Never Used  . Alcohol Use: 3.5 oz/week    7  drink(s) per week  . Drug Use: No  . Sexual Activity: Not Currently    Birth Control/ Protection: Abstinence   Other Topics Concern  . Not on file   Social History Narrative  . No narrative on file    Objective:  Filed Vitals:   06/16/14 1400  BP: 136/84  Pulse: 102  Temp: 99 F (37.2 C)  Resp: 16     General appearance: alert, cooperative and appears stated age Ears: normal TM's and external ear canals both ears Throat: lips, mucosa, and tongue normal; teeth and gums normal Neck: no adenopathy, no carotid bruit, supple, symmetrical, trachea midline and thyroid not enlarged, symmetric, no tenderness/mass/nodules Back: symmetric, no curvature. ROM normal. No CVA tenderness. Lungs: clear to auscultation bilaterally Heart: regular rate and rhythm, S1, S2 normal, no murmur, click, rub  or gallop Abdomen: soft, non-tender; bowel sounds normal; no masses,  no organomegaly Pulses: 2+ and symmetric Skin: Skin color, texture, turgor normal. No rashes or lesions Lymph nodes: Cervical, supraclavicular, and axillary nodes normal.  Assessment and Plan:  Depressive state  Adding cymbalta 20 mg daily given concurrent chronic pain   Tobacco abuse counseling Smoking cessation instruction/counseling given:  counseled patient on the dangers of tobacco use, advised patient to stop smoking, and reviewed strategies to maximize success   Updated Medication List Outpatient Encounter Prescriptions as of 06/16/2014  Medication Sig  . amLODipine (NORVASC) 5 MG tablet TAKE 1 TABLET BY MOUTH ONCE A DAY  . cetirizine (ZYRTEC) 10 MG tablet Take 10 mg by mouth daily.    . clonazePAM (KLONOPIN) 0.5 MG tablet Take 1 tablet (0.5 mg total) by mouth 2 (two) times daily as needed for anxiety.  . Multiple Vitamins-Minerals (WOMENS MULTIVITAMIN PLUS PO) Take by mouth.  . pantoprazole (PROTONIX) 40 MG tablet TAKE 1 TABLET BY MOUTH EVERY DAY AM 30 MINUTES PRIOR TO FOOD  . [DISCONTINUED] busPIRone (BUSPAR) 10 MG tablet TAKE 1 TABLET BY MOUTH 3 TIMES A DAY  . DULoxetine (CYMBALTA) 20 MG capsule Take 1 capsule (20 mg total) by mouth 2 (two) times daily.  . [DISCONTINUED] ALPRAZolam (XANAX) 0.5 MG tablet Take 1 tablet (0.5 mg total) by mouth at bedtime as needed (panic attack).  . [DISCONTINUED] Biotin 5000 MCG CAPS Take 1 capsule by mouth daily.  . [DISCONTINUED] PARoxetine (PAXIL-CR) 25 MG 24 hr tablet TAKE 1 TABLET BY MOUTH EVERY MORNING     No orders of the defined types were placed in this encounter.    No Follow-up on file.

## 2014-06-16 NOTE — Progress Notes (Signed)
Pre-visit discussion using our clinic review tool. No additional management support is needed unless otherwise documented below in the visit note.  

## 2014-06-18 ENCOUNTER — Encounter: Payer: Self-pay | Admitting: Internal Medicine

## 2014-06-18 NOTE — Assessment & Plan Note (Signed)
Adding cymbalta 20 mg daily given concurrent chronic pain

## 2014-06-18 NOTE — Assessment & Plan Note (Signed)
Smoking cessation instruction/counseling given:  counseled patient on the dangers of tobacco use, advised patient to stop smoking, and reviewed strategies to maximize success 

## 2014-07-07 ENCOUNTER — Other Ambulatory Visit: Payer: Self-pay | Admitting: Internal Medicine

## 2014-07-09 ENCOUNTER — Other Ambulatory Visit: Payer: Self-pay | Admitting: Internal Medicine

## 2014-07-19 ENCOUNTER — Other Ambulatory Visit: Payer: Self-pay | Admitting: Internal Medicine

## 2014-07-19 MED ORDER — CLONAZEPAM 0.5 MG PO TABS: 0.5000 mg | ORAL_TABLET | Freq: Two times a day (BID) | ORAL | Status: DC | PRN

## 2014-07-19 NOTE — Telephone Encounter (Signed)
Last visit 06/16/14, ok refill?

## 2014-07-19 NOTE — Telephone Encounter (Signed)
Ok to refill,  printed rx  

## 2014-09-29 ENCOUNTER — Other Ambulatory Visit: Payer: Self-pay | Admitting: Internal Medicine

## 2014-10-06 ENCOUNTER — Emergency Department: Payer: Self-pay | Admitting: Student

## 2014-10-06 LAB — COMPREHENSIVE METABOLIC PANEL
ANION GAP: 8 (ref 7–16)
Albumin: 3.6 g/dL (ref 3.4–5.0)
Alkaline Phosphatase: 100 U/L
BUN: 22 mg/dL — AB (ref 7–18)
Bilirubin,Total: 0.4 mg/dL (ref 0.2–1.0)
Calcium, Total: 8.9 mg/dL (ref 8.5–10.1)
Chloride: 106 mmol/L (ref 98–107)
Co2: 29 mmol/L (ref 21–32)
Creatinine: 0.84 mg/dL (ref 0.60–1.30)
EGFR (African American): >60
EGFR (Non-African Amer.): >60
Glucose: 171 mg/dL — ABNORMAL HIGH (ref 65–99)
OSMOLALITY: 292 (ref 275–301)
POTASSIUM: 3.9 mmol/L (ref 3.5–5.1)
SGOT(AST): 24 U/L (ref 15–37)
SGPT (ALT): 33 U/L
Sodium: 143 mmol/L (ref 136–145)
Total Protein: 6.9 g/dL (ref 6.4–8.2)

## 2014-10-06 LAB — URINALYSIS, COMPLETE
BACTERIA: NONE SEEN
BILIRUBIN, UR: NEGATIVE
Glucose,UR: NEGATIVE mg/dL (ref 0–75)
Ketone: NEGATIVE
LEUKOCYTE ESTERASE: NEGATIVE
Nitrite: NEGATIVE
PROTEIN: NEGATIVE
Ph: 6 (ref 4.5–8.0)
RBC,UR: 13 /HPF (ref 0–5)
SPECIFIC GRAVITY: 1.015 (ref 1.003–1.030)
WBC UR: 3 /HPF (ref 0–5)

## 2014-10-06 LAB — CBC
HCT: 43.6 % (ref 35.0–47.0)
HGB: 14.7 g/dL (ref 12.0–16.0)
MCH: 30.1 pg (ref 26.0–34.0)
MCHC: 33.7 g/dL (ref 32.0–36.0)
MCV: 89 fL (ref 80–100)
PLATELETS: 205 10*3/uL (ref 150–440)
RBC: 4.88 10*6/uL (ref 3.80–5.20)
RDW: 13.9 % (ref 11.5–14.5)
WBC: 7.6 10*3/uL (ref 3.6–11.0)

## 2014-10-06 LAB — LIPASE, BLOOD: Lipase: 196 U/L (ref 73–393)

## 2014-10-11 ENCOUNTER — Encounter: Payer: Self-pay | Admitting: Internal Medicine

## 2014-10-11 ENCOUNTER — Other Ambulatory Visit: Payer: Self-pay | Admitting: Internal Medicine

## 2014-10-11 MED ORDER — LEVOFLOXACIN 500 MG PO TABS: 500.0000 mg | ORAL_TABLET | Freq: Every day | ORAL | Status: DC

## 2014-10-11 NOTE — Telephone Encounter (Signed)
Please advise 

## 2014-11-22 ENCOUNTER — Other Ambulatory Visit: Payer: Self-pay | Admitting: Internal Medicine

## 2014-11-29 ENCOUNTER — Other Ambulatory Visit: Payer: Self-pay | Admitting: Internal Medicine

## 2014-12-03 ENCOUNTER — Other Ambulatory Visit: Payer: Self-pay | Admitting: Orthopedic Surgery

## 2015-01-29 ENCOUNTER — Other Ambulatory Visit: Payer: Self-pay | Admitting: Internal Medicine

## 2015-02-12 ENCOUNTER — Other Ambulatory Visit: Payer: Self-pay | Admitting: Internal Medicine

## 2015-02-17 ENCOUNTER — Other Ambulatory Visit: Payer: Self-pay | Admitting: Internal Medicine

## 2015-02-17 NOTE — Telephone Encounter (Signed)
Patient requesting Cymbalta Rx refill.  Last OV was 8.19.15.  Has a scheduled appt on 5.2.16.  Please advise?

## 2015-02-18 ENCOUNTER — Other Ambulatory Visit: Payer: Self-pay

## 2015-02-18 MED ORDER — DULOXETINE HCL 20 MG PO CPEP: 20.0000 mg | ORAL_CAPSULE | Freq: Two times a day (BID) | ORAL | Status: DC

## 2015-02-18 NOTE — Telephone Encounter (Signed)
Refill one 30 days only.  Has not been seen in over 6 months so needs office visit prior to any more refills 

## 2015-02-18 NOTE — Telephone Encounter (Signed)
Refilled for one month and needs appt thereafter.

## 2015-02-19 NOTE — Op Note (Signed)
PATIENT NAME:  Ann Baxter, [name] MR#:  [id number] DATE OF BIRTH:  1961/07/04  DATE OF PROCEDURE:  02/02/2014  PREOPERATIVE DIAGNOSIS: Right carpal tunnel syndrome.   POSTOPERATIVE DIAGNOSIS: Right carpal tunnel syndrome.   PROCEDURE: Right carpal tunnel release.   SURGEON: Clare Gandyhristopher E. Bridney Guadarrama, MD  ANESTHESIA: General.   COMPLICATIONS: None.   TOURNIQUET TIME: Approximately 15 minutes.   DESCRIPTION OF PROCEDURE: After adequate induction of general anesthesia, the right upper extremity is thoroughly prepped with alcohol and ChloraPrep and draped in standard sterile fashion. The extremity is wrapped out with the Esmarch bandage and pneumatic tourniquet elevated to 250 mmHg. Under loupe magnification, standard volar carpal tunnel incision is made and the dissection carried down to the transverse retinacular ligament. This is incised in the midportion. The proximal release is performed with the small scissors. The distal release is performed with the small scissors. The proximal release also was aided by using the carpal tunnel scissors. There was seen to be severe compression of the nerve with inflammation directly beneath the ligament. Careful check both proximally and distally was made to ensure that complete release had been obtained. There was mild synovitis present. No mass lesion is present. The wound is thoroughly irrigated multiple times. Skin edges are closed with 4-0 nylon. Skin edges are infiltrated with 0.5% plain Marcaine. A soft bulky dressing is applied. The tourniquet is released. The patient is returned to the recovery room in satisfactory condition, having tolerated the procedure quite well.    ____________________________ Clare Gandyhristopher E. Kentravious Lipford, MD ces:lb D: 02/02/2014 10:51:26 ET T: 02/02/2014 11:02:51 ET JOB#: [id number]  cc: Clare Gandyhristopher E. Eaven Schwager, MD, <Dictator> Clare GandyHRISTOPHER E Liset Mcmonigle MD ELECTRONICALLY SIGNED 02/14/2014 13:43

## 2015-02-28 ENCOUNTER — Ambulatory Visit: Payer: PRIVATE HEALTH INSURANCE | Admitting: Internal Medicine

## 2015-02-28 ENCOUNTER — Other Ambulatory Visit: Payer: Self-pay | Admitting: Internal Medicine

## 2015-02-28 NOTE — Telephone Encounter (Signed)
Appt 03/01/15 

## 2015-03-01 ENCOUNTER — Encounter: Payer: Self-pay | Admitting: Internal Medicine

## 2015-03-01 ENCOUNTER — Ambulatory Visit (INDEPENDENT_AMBULATORY_CARE_PROVIDER_SITE_OTHER): Payer: PRIVATE HEALTH INSURANCE | Admitting: Internal Medicine

## 2015-03-01 VITALS — BP 128/80 | HR 87 | Temp 98.9°F | Resp 14 | Ht 62.0 in | Wt 135.8 lb

## 2015-03-01 DIAGNOSIS — Z9889 Other specified postprocedural states: Secondary | ICD-10-CM

## 2015-03-01 DIAGNOSIS — F329 Major depressive disorder, single episode, unspecified: Secondary | ICD-10-CM

## 2015-03-01 DIAGNOSIS — I10 Essential (primary) hypertension: Secondary | ICD-10-CM | POA: Diagnosis not present

## 2015-03-01 DIAGNOSIS — E785 Hyperlipidemia, unspecified: Secondary | ICD-10-CM | POA: Diagnosis not present

## 2015-03-01 DIAGNOSIS — E559 Vitamin D deficiency, unspecified: Secondary | ICD-10-CM

## 2015-03-01 DIAGNOSIS — Z1239 Encounter for other screening for malignant neoplasm of breast: Secondary | ICD-10-CM

## 2015-03-01 DIAGNOSIS — F32A Depression, unspecified: Secondary | ICD-10-CM

## 2015-03-01 MED ORDER — CLONAZEPAM 0.5 MG PO TABS: 0.5000 mg | ORAL_TABLET | Freq: Two times a day (BID) | ORAL | Status: DC | PRN

## 2015-03-01 MED ORDER — AMLODIPINE BESYLATE 5 MG PO TABS: 5.0000 mg | ORAL_TABLET | Freq: Every day | ORAL | Status: DC

## 2015-03-01 MED ORDER — METHOCARBAMOL 750 MG PO TABS: 750.0000 mg | ORAL_TABLET | Freq: Three times a day (TID) | ORAL | Status: DC

## 2015-03-01 MED ORDER — DULOXETINE HCL 20 MG PO CPEP: 20.0000 mg | ORAL_CAPSULE | Freq: Two times a day (BID) | ORAL | Status: DC

## 2015-03-01 NOTE — Progress Notes (Signed)
Patient ID: Ann Baxter, female   DOB: [birth year], 54 y.o.   MRN: [id number]   Patient Active Problem List   Diagnosis Date Noted  . S/P carpal tunnel release 03/03/2015  . Essential hypertension 03/03/2015  . Hyperlipidemia LDL goal <130 08/26/2013  . Fall at home 01/12/2013  . Annual physical exam 02/01/2012  . Tobacco abuse counseling 09/25/2011  . Depressive state 09/25/2011  . Allergy   . GERD (gastroesophageal reflux disease)   . Tobacco abuse     Subjective:  CC:   Chief Complaint  Patient presents with  . Follow-up    medication    HPI:   Ann Baxter is a 54 y.o. female who presents for   Follow up on multiple issues including GAD with depression,  And wrist pain.   She had Carpal tunnel  Surgery redo  Right wrist  Dec 03 2014 th by hand surgeon  Dr.  Mina Marble at the [facility] in [place] (first one was done by Reita Chard April 2015)  .  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  Still having a little pain and decreased ROM.  At times pain is severe  8/10  ,  Surgeon says wait until  november . Stopped the gabapentin last week after a wean ,  Percocet stopped,  Going to ask for vicodin  Depression: symptoms are finally under contnrol.  Sleeping well,  Appetite fine  Feeling more sociable, not drinking.        Past Medical History  Diagnosis Date  . Allergy   . Bronchitis, chronic   . Tobacco abuse   . GERD (gastroesophageal reflux disease)     H Pylori Positive, treated July 2011    Past Surgical History  Procedure Laterality Date  . Cesarean section  1986       The following portions of the patient's history were reviewed and updated as appropriate: Allergies, current medications, and problem list.    Review of Systems:   Patient denies headache, fevers, malaise, unintentional weight loss, skin rash, eye pain, sinus congestion and sinus pain, sore throat, dysphagia,  hemoptysis ,  cough, dyspnea, wheezing, chest pain, palpitations, orthopnea, edema, abdominal pain, nausea, melena, diarrhea, constipation, flank pain, dysuria, hematuria, urinary  Frequency, nocturia, numbness, tingling, seizures,  Focal weakness, Loss of consciousness,  Tremor, insomnia, depression, anxiety, and suicidal ideation.     History   Social History  . Marital Status: Married    Spouse Name: N/A  . Number of Children: N/A  . Years of Education: N/A   Occupational History  . Not on file.   Social History Main Topics  . Smoking status: Current Every Day Smoker -- 1.00 packs/day for 35 years    Types: Cigarettes  . Smokeless tobacco: Never Used  . Alcohol Use: 3.5 oz/week    7 drink(s) per week  . Drug Use: No  . Sexual Activity: Not Currently    Birth Control/ Protection: Abstinence   Other Topics Concern  . Not on file   Social History Narrative    Objective:  Filed Vitals:   03/01/15 0824  BP: 128/80  Pulse: 87  Temp: 98.9 F (37.2 C)  Resp: 14     General appearance: alert, cooperative and appears stated age Ears: normal TM's and external ear canals both ears Throat: lips, mucosa, and tongue normal; teeth and gums normal Neck: no adenopathy, no carotid bruit, supple,  symmetrical, trachea midline and thyroid not enlarged, symmetric, no tenderness/mass/nodules Back: symmetric, no curvature. ROM normal. No CVA tenderness. Lungs: clear to auscultation bilaterally Heart: regular rate and rhythm, S1, S2 normal, no murmur, click, rub or gallop Abdomen: soft, non-tender; bowel sounds normal; no masses,  no organomegaly Pulses: 2+ and symmetric Skin: Skin color, texture, turgor normal. No rashes or lesions Lymph nodes: Cervical, supraclavicular, and axillary nodes normal.  Assessment and Plan:  Depressive state With anxiety,  Now managed with cymbalta bid,  Using clonazepam only as needed,  Not daily,  Usually for insomnia .  No changes today . Refills as needed     S/P carpal tunnel release Improved pain and ROM with redo done Feb 20`16 by hand surgeon.    Hyperlipidemia LDL goal <130 With elevated triglcyerides and low HDL, last check during a period of daily alcohol use. She will return for fasting labs soon   Lab Results  Component Value Date   CHOL 210* 08/19/2013   HDL 30* 08/19/2013   LDLCALC 103 08/19/2013   TRIG 385* 08/19/2013      Essential hypertension BP 128/80 mmHg  Pulse 87  Temp(Src) 98.9 F (37.2 C) (Oral)  Resp 14  Ht  (1.575 m)  Wt 135 lb 12 oz (61.576 kg)  BMI 24.82 kg/m2  SpO2 98%   Well controlled on current regimen. Renal function stable, no changes today.  Lab Results  Component Value Date   NA 143 10/06/2014   K 3.9 10/06/2014   CL 106 10/06/2014   CO2 29 10/06/2014   Lab Results  Component Value Date   CREATININE 0.84 10/06/2014       Updated Medication List Outpatient Encounter Prescriptions as of 03/01/2015  Medication Sig  . amLODipine (NORVASC) 5 MG tablet Take 1 tablet (5 mg total) by mouth daily.  . cetirizine (ZYRTEC) 10 MG tablet Take 10 mg by mouth daily.    . clonazePAM (KLONOPIN) 0.5 MG tablet Take 1 tablet (0.5 mg total) by mouth 2 (two) times daily as needed for anxiety.  . DULoxetine (CYMBALTA) 20 MG capsule Take 1 capsule (20 mg total) by mouth 2 (two) times daily.  . Multiple Vitamins-Minerals (WOMENS MULTIVITAMIN PLUS PO) Take by mouth.  . [DISCONTINUED] amLODipine (NORVASC) 5 MG tablet TAKE 1 TABLET BY MOUTH ONCE A DAY  . [DISCONTINUED] clonazePAM (KLONOPIN) 0.5 MG tablet Take 1 tablet (0.5 mg total) by mouth 2 (two) times daily as needed for anxiety.  . [DISCONTINUED] DULoxetine (CYMBALTA) 20 MG capsule Take 1 capsule (20 mg total) by mouth 2 (two) times daily.  . methocarbamol (ROBAXIN-750) 750 MG tablet Take 1 tablet (750 mg total) by mouth 3 (three) times daily.  . [DISCONTINUED] amLODipine (NORVASC) 5 MG tablet Take 1 tablet (5 mg total) by mouth daily. (Patient  not taking: Reported on 03/01/2015)  . [DISCONTINUED] levofloxacin (LEVAQUIN) 500 MG tablet Take 1 tablet (500 mg total) by mouth daily. (Patient not taking: Reported on 03/01/2015)  . [DISCONTINUED] pantoprazole (PROTONIX) 40 MG tablet TAKE 1 TABLET BY MOUTH EVERY DAY AM 30 MINUTES PRIOR TO FOOD (Patient not taking: Reported on 03/01/2015)   No facility-administered encounter medications on file as of 03/01/2015.   A total of 25 minutes was spent with patient more than half of which was spent in counseling patient on the above mentioned issues , reviewing and explaining recent labs and imaging studies done, and coordination of care.   Orders Placed This Encounter  Procedures  . CBC with  Differential/Platelet  . Comprehensive metabolic panel  . TSH  . Lipid panel  . Vit D  25 hydroxy (rtn osteoporosis monitoring)    No Follow-up on file.

## 2015-03-01 NOTE — Patient Instructions (Addendum)
Continue the cymbalta twice daily for your depression,  And the clonazepam AS NEEDED   Adding muscle relaxer for your shoulder pain    famotidine 20 mg daily or twice daily should take the place of protonix.  If not , you will have burning,  Reflux etc and need to resume protonix  GET your fasting labs at your husband's office in October

## 2015-03-01 NOTE — Assessment & Plan Note (Addendum)
With anxiety,  Now managed with cymbalta bid,  Using clonazepam only as needed,  Not daily,  Usually for insomnia .  No changes today . Refills as needed

## 2015-03-01 NOTE — Progress Notes (Signed)
Pre-visit discussion using our clinic review tool. No additional management support is needed unless otherwise documented below in the visit note.  

## 2015-03-03 ENCOUNTER — Encounter: Payer: Self-pay | Admitting: Internal Medicine

## 2015-03-03 DIAGNOSIS — Z9889 Other specified postprocedural states: Secondary | ICD-10-CM | POA: Insufficient documentation

## 2015-03-03 DIAGNOSIS — I10 Essential (primary) hypertension: Secondary | ICD-10-CM | POA: Insufficient documentation

## 2015-03-03 NOTE — Assessment & Plan Note (Signed)
With elevated triglcyerides and low HDL, last check during a period of daily alcohol use. She will return for fasting labs soon   Lab Results  Component Value Date   CHOL 210* 08/19/2013   HDL 30* 08/19/2013   LDLCALC 103 08/19/2013   TRIG 385* 08/19/2013

## 2015-03-03 NOTE — Assessment & Plan Note (Signed)
Improved pain and ROM with redo done Feb 20`16 by hand surgeon.

## 2015-03-03 NOTE — Assessment & Plan Note (Signed)
BP 128/80 mmHg  Pulse 87  Temp(Src) 98.9 F (37.2 C) (Oral)  Resp 14  Ht 5\' 2"  (1.575 m)  Wt 135 lb 12 oz (61.576 kg)  BMI 24.82 kg/m2  SpO2 98%   Well controlled on current regimen. Renal function stable, no changes today.  Lab Results  Component Value Date   NA 143 10/06/2014   K 3.9 10/06/2014   CL 106 10/06/2014   CO2 29 10/06/2014   Lab Results  Component Value Date   CREATININE 0.84 10/06/2014

## 2015-05-05 ENCOUNTER — Other Ambulatory Visit: Payer: Self-pay | Admitting: Internal Medicine

## 2015-07-13 ENCOUNTER — Other Ambulatory Visit: Payer: Self-pay | Admitting: *Deleted

## 2015-07-13 MED ORDER — DULOXETINE HCL 20 MG PO CPEP: 20.0000 mg | ORAL_CAPSULE | Freq: Two times a day (BID) | ORAL | Status: DC

## 2015-08-09 LAB — LIPID PANEL
CHOLESTEROL: 223 mg/dL — AB (ref 0–200)
HDL: 34 mg/dL — AB (ref 35–70)
LDL Cholesterol: 127 mg/dL
TRIGLYCERIDES: 309 mg/dL — AB (ref 40–160)

## 2015-08-09 LAB — HEMOGLOBIN A1C: Hgb A1c MFr Bld: 8.8 % — AB (ref 4.0–6.0)

## 2015-08-09 LAB — CBC AND DIFFERENTIAL
HEMATOCRIT: 47 % — AB (ref 36–46)
HEMOGLOBIN: 15.7 g/dL (ref 12.0–16.0)
NEUTROS ABS: 7 /uL
Platelets: 253 10*3/uL (ref 150–399)
WBC: 9.6 10^3/mL

## 2015-08-09 LAB — BASIC METABOLIC PANEL
BUN: 17 mg/dL (ref 4–21)
Creatinine: 0.8 mg/dL (ref 0.5–1.1)
Glucose: 126 mg/dL
POTASSIUM: 4.3 mmol/L (ref 3.4–5.3)
SODIUM: 146 mmol/L (ref 137–147)

## 2015-08-09 LAB — HEPATIC FUNCTION PANEL
ALT: 21 U/L (ref 7–35)
AST: 14 U/L (ref 13–35)
Alkaline Phosphatase: 97 U/L (ref 25–125)
Bilirubin, Total: 0.4 mg/dL

## 2015-08-09 LAB — TSH: TSH: 1.22 u[IU]/mL (ref 0.41–5.90)

## 2015-08-17 ENCOUNTER — Other Ambulatory Visit: Payer: Self-pay | Admitting: Internal Medicine

## 2015-08-17 MED ORDER — CLONAZEPAM 0.5 MG PO TABS: 0.5000 mg | ORAL_TABLET | Freq: Two times a day (BID) | ORAL | Status: DC | PRN

## 2015-08-17 NOTE — Telephone Encounter (Signed)
Last OV 03/01/15 ok to refill clonazepam?

## 2015-08-17 NOTE — Telephone Encounter (Signed)
Refill for 30 days only.  OFFICE VISIT NEEDED prior to any more refills 

## 2015-08-19 ENCOUNTER — Telehealth: Payer: Self-pay | Admitting: Internal Medicine

## 2015-08-19 ENCOUNTER — Ambulatory Visit (INDEPENDENT_AMBULATORY_CARE_PROVIDER_SITE_OTHER): Payer: PRIVATE HEALTH INSURANCE | Admitting: Internal Medicine

## 2015-08-19 ENCOUNTER — Other Ambulatory Visit (HOSPITAL_COMMUNITY)
Admission: RE | Admit: 2015-08-19 | Discharge: 2015-08-19 | Disposition: A | Discharge status: Home / Self Care | Payer: PRIVATE HEALTH INSURANCE | Source: Ambulatory Visit | Attending: Internal Medicine | Admitting: Internal Medicine

## 2015-08-19 ENCOUNTER — Encounter: Payer: Self-pay | Admitting: Internal Medicine

## 2015-08-19 VITALS — BP 128/80 | HR 89 | Temp 98.6°F | Resp 12 | Ht 62.5 in | Wt 132.5 lb

## 2015-08-19 DIAGNOSIS — E8881 Metabolic syndrome: Secondary | ICD-10-CM | POA: Diagnosis not present

## 2015-08-19 DIAGNOSIS — L988 Other specified disorders of the skin and subcutaneous tissue: Secondary | ICD-10-CM | POA: Diagnosis not present

## 2015-08-19 DIAGNOSIS — Z716 Tobacco abuse counseling: Secondary | ICD-10-CM | POA: Diagnosis not present

## 2015-08-19 DIAGNOSIS — Z1151 Encounter for screening for human papillomavirus (HPV): Secondary | ICD-10-CM | POA: Diagnosis not present

## 2015-08-19 DIAGNOSIS — Z0000 Encounter for general adult medical examination without abnormal findings: Secondary | ICD-10-CM | POA: Diagnosis not present

## 2015-08-19 DIAGNOSIS — F331 Major depressive disorder, recurrent, moderate: Secondary | ICD-10-CM | POA: Diagnosis not present

## 2015-08-19 DIAGNOSIS — F4323 Adjustment disorder with mixed anxiety and depressed mood: Secondary | ICD-10-CM

## 2015-08-19 DIAGNOSIS — E669 Obesity, unspecified: Secondary | ICD-10-CM

## 2015-08-19 DIAGNOSIS — Z124 Encounter for screening for malignant neoplasm of cervix: Secondary | ICD-10-CM | POA: Diagnosis not present

## 2015-08-19 DIAGNOSIS — Z01419 Encounter for gynecological examination (general) (routine) without abnormal findings: Secondary | ICD-10-CM | POA: Diagnosis present

## 2015-08-19 DIAGNOSIS — N951 Menopausal and female climacteric states: Secondary | ICD-10-CM | POA: Diagnosis not present

## 2015-08-19 MED ORDER — NYSTATIN-TRIAMCINOLONE 100000-0.1 UNIT/GM-% EX OINT: 1.0000 "application " | TOPICAL_OINTMENT | Freq: Two times a day (BID) | CUTANEOUS | Status: DC

## 2015-08-19 MED ORDER — VARENICLINE TARTRATE 0.5 MG X 11 & 1 MG X 42 PO MISC: ORAL | Status: DC

## 2015-08-19 MED ORDER — ESCITALOPRAM OXALATE 5 MG PO TABS: 5.0000 mg | ORAL_TABLET | Freq: Every day | ORAL | Status: DC

## 2015-08-19 NOTE — Telephone Encounter (Signed)
Pt would like to remove both of the [facility] on file. If possible. Thank You!

## 2015-08-19 NOTE — Telephone Encounter (Signed)
Both [facility] have been removed from pt list

## 2015-08-19 NOTE — Assessment & Plan Note (Addendum)
Smoking cessation instruction/counseling given:  counseled patient on the dangers of tobacco use, advised patient to stop smoking, and reviewed strategies to maximize success.  Discuss of MOA of Chantix given tha tit has been requested by  PATIENT

## 2015-08-19 NOTE — Patient Instructions (Signed)
Your triglcycerides and fasting glucose place you at risk for developing type 2 DM These can both be improved/addressed with a low glycemic index diet and regular exercise (walking for 30 minutes daily)  Starting 5 mg daily lexapro to see if it helps the hot flahses AND the anxiety  Nystatin/triamcinolone ointment  for the rash.  Use it twice daily and pack the area with gauze or start wearing underwear.  The goal is to keep the area DRY   This is  my version of a  "Low GI"  Weight loss Diet:  It has enabled many of my patients to lose up to 10 lbs per month depending on how much you restrict your carbs.   follow it carefully.  The idea behind low carb diets is that your body has to take the fat and protein in your food and turn it into an energy that is less efficient than carbohydrates, so you lose weight.    I have listed several choices at each of your 6 meal times to make it easy to shop.and plan    Remember that snack Substitutions should be equal to or less than 5 NET carbs per serving and  The only carbs in meals come from the pasta or vegetables so, they should be < 15 net carbs. Remember that carbohydrates from fiber do not count, so you can  subtract fiber grams to get the "net carbs " of any particular food item.    All of the foods can be found at grocery stores and in bulk at [employer].  The Atkins protein bars and shakes are available in more varieties at Target, WalMart and [employer].     7 AM Breakfast:  Choose from the following:  Low carbohydrate Protein  Shakes (I recommend the EAS AdvantEdge "Carb Control" shakes, Atkins,  Muscle Milk or Premier Protein shakes  All are < 4  carbs . My favorite is the premier protein chocolate   a scrambled egg/bacon/cheese burrito made with Mission's "carb balance" whole wheat tortilla  (this particular tortilla has only 6 net carbs, once you subtract the fiber grams  )  A slice of home made fritatta (egg based dish without a crust:  google  it)    Avoid cereal and bananas, oatmeal and cream of wheat and grits. They are loaded with carbohydrates and have too few fiber grams    10 AM: high protein snack  Protein bar by Atkins  Or KIND  (the lw sugar variety,  Not all are low , under 200 cal, usually < 6 net carbs).    A stick of cheese:  Around 1 carb,  100 cal      Other so called "protein bars" and Greek yogurts tend to be loaded with carbohydrates.  Remember, in food advertising, the word "energy" is synonymous for " carbohydrate."  Lunch:   A Sandwich using the bread choices listed below,  You  Can use any  Eggs,  lunchmeat, grilled meat or canned tuna), avocado, regular mayo/mustard  and cheese.  A Salad using blue cheese, ranch,  Goddess or vinagrette,  No croutons or "confetti" and no "candied nuts" but regular nuts OK. No "fat free": they add sugar for taste  No pretzels or chips.  Pickles and miniature sweet peppers are a good low carb alternative that provide a "crunch"   Please Note:  The bread is the only source of carbohydrate in a sandwich and  can be decreased by  trying some of these alternatives to traditional loaf bread. Karin Golden has the best selection:  Joseph's makes a pita bread and a flat ("Lavash")  bread that are 50 cal and 4 net carbs and are available at [employer]Js and WalMart.  These can be toasted to make them taste better,  And can be  used as a pita chip to use with hummous as well  Toufayan makes a low carb flatbread that's 100 cal and 9 net carbs available at [employer] and [employer] makes 2 sizes of  Low carb whole wheat tortilla  (The large one is 210 cal and 6 net carbs, the small is 120 cal and also 6 net carbs)  Flat Out makes flatbreads that are low carb as well . They're called "Fold Its")   Avoid "Low fat dressings, as well as Reyne Dumas and [address] dressings They are loaded with sugar!   3 PM/ Mid day  Snack:  Consider  1 ounce of  almonds, walnuts, pistachios, pecans, peanuts,   Macadamia nuts or a nut medley are ok .  Avoid "granola"; the dried cranberries and raisins are loaded with carbohydrates. Mixed nuts as long as there are no raisins,  cranberries or dried fruit.    Try the prosciutto/mozzarella cheese sticks by [employer]  In deli /backery section   High protein 80 cal each , 1 carb   To avoid overindulging in snacks: Try drinking a glass of unsweeted almond/coconut milk  Or a cup of coffee with your Atkins chocolate bar to keep you from having 3!!!   Pork rinds!  Yes Pork Rinds  Are on the diet .  They are your potato chip.        6 PM  Dinner:     Meat/fowl/fish with a green salad, with steamed/grilled/sauteed vegggie: broccoli, cauliflower, green beans, spinach, brussel sprouts or  Lima beans. DO NOT BREAD THE PROTEIN!!      There is a low carb pasta by Dreamfield's that is acceptable and tastes great: only 5 digestible carbs/serving.( All grocery stores but BJs carry it )  Try Kai Levins Angelo's chicken piccata or chicken or eggplant parm over low carb pasta.(Lowes and BJs)   Clifton Custard Sanchez's "Carnitas" (pulled pork, no sauce,  0 carbs) can be used to make a dinner  burrito (available at BJ's ) and his pot roast is also without veggies or potatoes   Pesto over low carb pasta (bj's sells a good quality pesto in the center refrigerated section of the deli   Try satueeing  Roosvelt Harps with mushrooms as another side dish   Whole wheat pasta is still full of digestible carbs and  Not as low in glycemic index as Dreamfield's.   Brown rice is still rice,  So skip the rice and noodles if you eat [place] or [place] (or at least limit to 1 cooked cup)  9 PM snack :   Breyer's "low carb" fudgsicle or  ice cream bar (Carb Smart line), or  Weight Watcher's ice cream bar , or another "no sugar added" ice cream;  a serving of fresh berries/cherries with whipped cream   Cheese or DANN[employer]S [employer] YOGURT or the [employer]os greek yogurt   8 ounces of [place] unsweetened  almond/cococunut milk  Cheese and crackers (using WASA crackers,  They are low carb) or peanut butter on low carb crackers or pita bread     Avoid bananas, pineapple, grapes  and watermelon on a regular  basis because they are high in sugar.  THINK OF THEM AS DESSERT. Stick to" berries and cherries"

## 2015-08-19 NOTE — Progress Notes (Signed)
Pre-visit discussion using our clinic review tool. No additional management support is needed unless otherwise documented below in the visit note.  

## 2015-08-19 NOTE — Progress Notes (Signed)
Patient ID: Ann Baxter, female    DOB: September 16, 1961  Age: 54 y.o. MRN: [id number]  The patient is here for annual gyn/wellness examination and management of other chronic and acute problems.   The risk factors are reflected in the social history.    The roster of all physicians providing medical care to patient - is listed in the Snapshot section of the chart.  Home safety : The patient has smoke detectors in the home. They wear seatbelts.  There are no firearms at home. There is no violence in the home.   There is no risks for hepatitis, STDs or HIV. There is no   history of blood transfusion. They have no travel history to infectious disease endemic areas of the world.  The patient has seen their dentist in the last six month. They have not seen their eye doctor in the last year. T  They do not  have excessive sun exposure. Discussed the need for sun protection: hats, long sleeves and use of sunscreen if there is significant sun exposure.   Diet: the importance of a healthy diet is discussed. They do have a healthy diet.  The benefits of regular aerobic exercise were discussed. She walks 4 times per week ,  20 minutes.   Depression screen: there are no signs or vegative symptoms of depression- irritability, change in appetite, anhedonia, sadness/tearfullness.  The following portions of the patient's history were reviewed and updated as appropriate: allergies, current medications, past family history, past medical history,  past surgical history, past social history  and problem list.  Visual acuity was not assessed per patient preference since she has regular follow up with her ophthalmologist. Hearing and body mass index were assessed and reviewed.   During the course of the visit the patient was educated and counseled about appropriate screening and preventive services including : fall prevention , diabetes screening, nutrition counseling, colorectal cancer screening, and recommended  immunizations.    CC: The primary encounter diagnosis was Visit for preventive health examination. Diagnoses of Maceration of skin, Tobacco abuse counseling, Cervical cancer screening, Major depressive disorder, recurrent episode, moderate (HCC), Adjustment disorder with mixed anxiety and depressed mood, Symptomatic menopausal or female climacteric states, and Abdominal obesity-metabolic syndrome type 3 (HCC) were also pertinent to this visit.   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  .She cannot pick up anything heavier than a can of vegetables with the right hand, and drops things repeatedly.  She is applying for disability.  Trial of  lyrica aggravated her symptoms of depression/anxiety , leading to recurrent alcohol abuse, so she stopped the lyrica and has since then stopped drinking alcohol .  She os not using any narcotics for pain management.    Using clonazepam prn  Anxiety and cymbalta daily   Shoulder pain better with use of robaxin up to 2 daily    Ready to quit smoking . Requesting Chantix    History Ann Baxter has a past medical history of Allergy; Bronchitis, chronic (HCC); Tobacco abuse; and GERD (gastroesophageal reflux disease).   She has past surgical history that includes Cesarean section (1986).   Her family history includes Cancer in her maternal aunt; Depression in her mother; Diabetes in her father and mother; Hyperlipidemia in her father and mother; Hypertension in her mother; Mental illness (age of onset: 82) in her mother.She reports that  she has been smoking Cigarettes.  She has a 35 pack-year smoking history. She has never used smokeless tobacco. She reports that she drinks about 3.5 oz of alcohol per week. She reports that she does not use illicit drugs.  Outpatient Prescriptions Prior to Visit  Medication Sig Dispense Refill  .  amLODipine (NORVASC) 5 MG tablet TAKE ONE (1) TABLET EACH DAY 30 tablet 6  . cetirizine (ZYRTEC) 10 MG tablet Take 10 mg by mouth daily.      . clonazePAM (KLONOPIN) 0.5 MG tablet Take 1 tablet (0.5 mg total) by mouth 2 (two) times daily as needed for anxiety. 60 tablet 0  . DULoxetine (CYMBALTA) 20 MG capsule Take 1 capsule (20 mg total) by mouth 2 (two) times daily. 60 capsule 12  . methocarbamol (ROBAXIN-750) 750 MG tablet Take 1 tablet (750 mg total) by mouth 3 (three) times daily. 90 tablet 3  . Multiple Vitamins-Minerals (WOMENS MULTIVITAMIN PLUS PO) Take by mouth.     No facility-administered medications prior to visit.    Review of Systems   Patient denies headache, fevers, malaise, unintentional weight loss, skin rash, eye pain, sinus congestion and sinus pain, sore throat, dysphagia,  hemoptysis , cough, dyspnea, wheezing, chest pain, palpitations, orthopnea, edema, abdominal pain, nausea, melena, diarrhea, constipation, flank pain, dysuria, hematuria, urinary  Frequency, nocturia, numbness, tingling, seizures,  Focal weakness, Loss of consciousness,  Tremor, insomnia, depression, anxiety, and suicidal ideation.      Objective:  BP 128/80 mmHg  Pulse 89  Temp(Src) 98.6 F (37 C) (Oral)  Resp 12  Ht 5' 2.5" (1.588 m)  Wt 132 lb 8 oz (60.102 kg)  BMI 23.83 kg/m2  SpO2 99%  Physical Exam   General Appearance:    Alert, cooperative, no distress, appears stated age  Head:    Normocephalic, without obvious abnormality, atraumatic  Eyes:    PERRL, conjunctiva/corneas clear, EOM's intact, fundi    benign, both eyes  Ears:    Normal TM's and external ear canals, both ears  Nose:   Nares normal, septum midline, mucosa normal, no drainage    or sinus tenderness  Throat:   Lips, mucosa, and tongue normal; teeth and gums normal  Neck:   Supple, symmetrical, trachea midline, no adenopathy;    thyroid:  no enlargement/tenderness/nodules; no carotid   bruit or JVD  Back:      Symmetric, no curvature, ROM normal, no CVA tenderness  Lungs:     Clear to auscultation bilaterally, respirations unlabored  Chest Wall:    No tenderness or deformity   Heart:    Regular rate and rhythm, S1 and S2 normal, no murmur, rub   or gallop  Breast Exam:    No tenderness, masses, or nipple abnormality  Abdomen:     Soft, non-tender, bowel sounds active all four quadrants,    no masses, no organomegaly  Genitalia:    Pelvic: cervix normal in appearance, external genitalia normal, no adnexal masses or tenderness, no cervical motion tenderness, rectovaginal septum normal, uterus normal size, shape, and consistency and vagina normal without discharge  Extremities:   Extremities normal, atraumatic, no cyanosis or edema  Pulses:   2+ and symmetric all extremities  Skin:   Intergluteal  maceration and desquamation   Lymph nodes:   Cervical, supraclavicular, and axillary nodes normal  Neurologic:   CNII-XII intact, normal strength, sensation and reflexes    throughout      Assessment & Plan:   Problem List Items Addressed This  Visit    Tobacco abuse counseling    Smoking cessation instruction/counseling given:  counseled patient on the dangers of tobacco use, advised patient to stop smoking, and reviewed strategies to maximize success.  Discuss of MOA of Chantix given tha tit has been requested by  PATIENT       Major depressive disorder, recurrent episode, moderate (HCC)    Improved with suspension of lyrica.  Continue cymbalta.       Relevant Medications   escitalopram (LEXAPRO) 5 MG tablet   Maceration of skin    Occuring in her intergluteal fold due to recurrent hot flashes and lck of daily use of undergarments.  Nystatin /triamcinolone cream bid.  instructed to keep area dry      Adjustment disorder with mixed anxiety and depressed mood    continue prn clonazepam. Adding low dose lexapro given concurrent menopausal state with frequent hot flashes.  The risks and benefits of  benzodiazepine use were discussed with patient today including excessive sedation leading to respiratory depression,  impaired thinking/driving, and addiction.  Patient was advised to avoid concurrent use with alcohol, to use medication only as needed and not to share with others  .        Visit for preventive health examination - Primary    Annual wellness  exam was done as well as a comprehensive physical exam  .  During the course of the visit the patient was educated and counseled about appropriate screening and preventive services and screenings were brought up to date for cervical and breast cancer .    She is s/p hysterectomy (total) . She will return for fasting labs to provide samples for diabetes screening and lipid analysis with projected  10 year  risk for CAD. nutrition counseling, skin cancer screening has been recommended, along with review of the age appropriate recommended immunizations.  Printed recommendations for health maintenance screenings was given.        Symptomatic menopausal or female climacteric states    Adding low dose lexapro given concurrent menopausal state with frequent hot flashes.       Abdominal obesity-metabolic syndrome type 3 (HCC)    With impaired fasting glucose,  Elevated triglycerides. I have addressed  BMI and recommended wt loss of 10% of body weight over the next 6 months using a low glycemic index diet and regular exercise a minimum of 5 days per week.         Other Visit Diagnoses    Cervical cancer screening        Relevant Orders    Cytology - PAP       I am having Ms. Corvera start on nystatin-triamcinolone ointment, escitalopram, and varenicline. I am also having her maintain her cetirizine, Multiple Vitamins-Minerals (WOMENS MULTIVITAMIN PLUS PO), methocarbamol, amLODipine, DULoxetine, and clonazePAM.  Meds ordered this encounter  Medications  . nystatin-triamcinolone ointment (MYCOLOG)    Sig: Apply 1 application topically 2 (two)  times daily. To affected area.  Cover with cotton gauze    Dispense:  30 g    Refill:  2  . escitalopram (LEXAPRO) 5 MG tablet    Sig: Take 1 tablet (5 mg total) by mouth daily.    Dispense:  30 tablet    Refill:  2    Generic please  . varenicline (CHANTIX STARTING MONTH PAK) 0.5 MG X 11 & 1 MG X 42 tablet    Sig: Take one 0.5 mg tablet by mouth once daily for 3 days, then  increase to one 0.5 mg tablet twice daily for 4 days, then increase to one 1 mg tablet twice daily.    Dispense:  53 tablet    Refill:  0    There are no discontinued medications.  Follow-up: Return in about 3 months (around 11/19/2015).   Sherlene ShamsULLO, Camilia Caywood L, MD

## 2015-08-21 DIAGNOSIS — E119 Type 2 diabetes mellitus without complications: Secondary | ICD-10-CM | POA: Insufficient documentation

## 2015-08-21 DIAGNOSIS — F4323 Adjustment disorder with mixed anxiety and depressed mood: Secondary | ICD-10-CM | POA: Insufficient documentation

## 2015-08-21 DIAGNOSIS — Z0000 Encounter for general adult medical examination without abnormal findings: Secondary | ICD-10-CM | POA: Insufficient documentation

## 2015-08-21 DIAGNOSIS — N951 Menopausal and female climacteric states: Secondary | ICD-10-CM | POA: Insufficient documentation

## 2015-08-21 NOTE — Assessment & Plan Note (Signed)
With impaired fasting glucose,  Elevated triglycerides. I have addressed  BMI and recommended wt loss of 10% of body weight over the next 6 months using a low glycemic index diet and regular exercise a minimum of 5 days per week.

## 2015-08-21 NOTE — Assessment & Plan Note (Signed)
Improved with suspension of lyrica.  Continue cymbalta.

## 2015-08-21 NOTE — Assessment & Plan Note (Signed)
Annual wellness  exam was done as well as a comprehensive physical exam  .  During the course of the visit the patient was educated and counseled about appropriate screening and preventive services and screenings were brought up to date for cervical and breast cancer .    She is s/p hysterectomy (total) . She will return for fasting labs to provide samples for diabetes screening and lipid analysis with projected  10 year  risk for CAD. nutrition counseling, skin cancer screening has been recommended, along with review of the age appropriate recommended immunizations.  Printed recommendations for health maintenance screenings was given.

## 2015-08-21 NOTE — Assessment & Plan Note (Signed)
Occuring in her intergluteal fold due to recurrent hot flashes and lck of daily use of undergarments.  Nystatin /triamcinolone cream bid.  instructed to keep area dry

## 2015-08-21 NOTE — Assessment & Plan Note (Addendum)
continue prn clonazepam. Adding low dose lexapro given concurrent menopausal state with frequent hot flashes.  The risks and benefits of benzodiazepine use were discussed with patient today including excessive sedation leading to respiratory depression,  impaired thinking/driving, and addiction.  Patient was advised to avoid concurrent use with alcohol, to use medication only as needed and not to share with others  .

## 2015-08-21 NOTE — Assessment & Plan Note (Signed)
Adding low dose lexapro given concurrent menopausal state with frequent hot flashes.

## 2015-08-22 ENCOUNTER — Encounter: Payer: Self-pay | Admitting: Internal Medicine

## 2015-08-22 ENCOUNTER — Telehealth: Payer: Self-pay | Admitting: Internal Medicine

## 2015-08-22 LAB — CYTOLOGY - PAP

## 2015-08-23 ENCOUNTER — Encounter: Payer: Self-pay | Admitting: Internal Medicine

## 2015-08-26 ENCOUNTER — Encounter: Payer: PRIVATE HEALTH INSURANCE | Admitting: Internal Medicine

## 2015-10-14 ENCOUNTER — Ambulatory Visit: Payer: PRIVATE HEALTH INSURANCE | Admitting: Internal Medicine

## 2015-11-18 ENCOUNTER — Other Ambulatory Visit: Payer: Self-pay | Admitting: Family Medicine

## 2015-11-18 DIAGNOSIS — F331 Major depressive disorder, recurrent, moderate: Secondary | ICD-10-CM

## 2015-11-18 MED ORDER — ESCITALOPRAM OXALATE 5 MG PO TABS: 5.0000 mg | ORAL_TABLET | Freq: Every day | ORAL | Status: DC

## 2015-12-23 ENCOUNTER — Other Ambulatory Visit: Payer: Self-pay

## 2015-12-23 MED ORDER — AMLODIPINE BESYLATE 5 MG PO TABS: ORAL_TABLET | ORAL | Status: DC

## 2015-12-23 NOTE — Telephone Encounter (Signed)
Ok to refill,  Refill sent  

## 2015-12-26 ENCOUNTER — Encounter: Payer: Self-pay | Admitting: Internal Medicine

## 2015-12-26 ENCOUNTER — Other Ambulatory Visit: Payer: Self-pay | Admitting: Internal Medicine

## 2015-12-26 NOTE — Telephone Encounter (Signed)
Last refilled in October with 60 day supply no refill. Please advise?

## 2015-12-27 MED ORDER — CLONAZEPAM 0.5 MG PO TABS: 0.5000 mg | ORAL_TABLET | Freq: Two times a day (BID) | ORAL | Status: DC | PRN

## 2015-12-27 NOTE — Telephone Encounter (Signed)
Patient scheduled on 01/30/16 @ 1030 a.m.

## 2015-12-27 NOTE — Telephone Encounter (Signed)
Refilled.  6 month folow up needed in April please scedule

## 2016-01-30 ENCOUNTER — Ambulatory Visit: Payer: PRIVATE HEALTH INSURANCE | Admitting: Internal Medicine

## 2016-02-14 ENCOUNTER — Encounter: Payer: Self-pay | Admitting: Internal Medicine

## 2016-02-14 ENCOUNTER — Ambulatory Visit: Payer: PRIVATE HEALTH INSURANCE | Admitting: Internal Medicine

## 2016-02-14 VITALS — BP 148/88 | HR 88 | Temp 98.1°F | Resp 12 | Ht 63.0 in | Wt 133.2 lb

## 2016-02-14 DIAGNOSIS — E669 Obesity, unspecified: Secondary | ICD-10-CM

## 2016-02-14 DIAGNOSIS — E119 Type 2 diabetes mellitus without complications: Secondary | ICD-10-CM

## 2016-02-14 DIAGNOSIS — F4323 Adjustment disorder with mixed anxiety and depressed mood: Secondary | ICD-10-CM

## 2016-02-14 DIAGNOSIS — E1159 Type 2 diabetes mellitus with other circulatory complications: Secondary | ICD-10-CM

## 2016-02-14 DIAGNOSIS — E1169 Type 2 diabetes mellitus with other specified complication: Secondary | ICD-10-CM

## 2016-02-14 DIAGNOSIS — M25512 Pain in left shoulder: Secondary | ICD-10-CM

## 2016-02-14 DIAGNOSIS — Z716 Tobacco abuse counseling: Secondary | ICD-10-CM

## 2016-02-14 DIAGNOSIS — I10 Essential (primary) hypertension: Secondary | ICD-10-CM

## 2016-02-14 DIAGNOSIS — E785 Hyperlipidemia, unspecified: Secondary | ICD-10-CM

## 2016-02-14 DIAGNOSIS — R0683 Snoring: Secondary | ICD-10-CM

## 2016-02-14 LAB — MICROALBUMIN / CREATININE URINE RATIO
Creatinine,U: 124.3 mg/dL
MICROALB/CREAT RATIO: 1.4 mg/g (ref 0.0–30.0)
Microalb, Ur: 1.8 mg/dL (ref 0.0–1.9)

## 2016-02-14 LAB — COMPREHENSIVE METABOLIC PANEL
ALBUMIN: 4.4 g/dL (ref 3.5–5.2)
ALK PHOS: 110 U/L (ref 39–117)
ALT: 21 U/L (ref 0–35)
AST: 15 U/L (ref 0–37)
BILIRUBIN TOTAL: 0.5 mg/dL (ref 0.2–1.2)
BUN: 16 mg/dL (ref 6–23)
CO2: 30 mEq/L (ref 19–32)
Calcium: 10.1 mg/dL (ref 8.4–10.5)
Chloride: 99 mEq/L (ref 96–112)
Creatinine, Ser: 0.65 mg/dL (ref 0.40–1.20)
GFR: 100.09 mL/min (ref >60.00)
GLUCOSE: 80 mg/dL (ref 70–99)
Potassium: 3.4 mEq/L — ABNORMAL LOW (ref 3.5–5.1)
Sodium: 139 mEq/L (ref 135–145)
TOTAL PROTEIN: 7.5 g/dL (ref 6.0–8.3)

## 2016-02-14 LAB — LIPID PANEL
CHOL/HDL RATIO: 6
Cholesterol: 175 mg/dL (ref 0–200)
HDL: 30.3 mg/dL — ABNORMAL LOW (ref >39.00)
NonHDL: 144.92
Triglycerides: 277 mg/dL — ABNORMAL HIGH (ref 0.0–149.0)
VLDL: 55.4 mg/dL — AB (ref 0.0–40.0)

## 2016-02-14 LAB — POCT URINALYSIS DIPSTICK
BILIRUBIN UA: NEGATIVE
Glucose, UA: NEGATIVE
KETONES UA: NEGATIVE
Nitrite, UA: NEGATIVE
Protein, UA: NEGATIVE
Spec Grav, UA: 1.015
Urobilinogen, UA: 0.2
pH, UA: 5.5

## 2016-02-14 LAB — LDL CHOLESTEROL, DIRECT: LDL DIRECT: 107 mg/dL

## 2016-02-14 LAB — HEMOGLOBIN A1C: HEMOGLOBIN A1C: 6.3 % (ref 4.6–6.5)

## 2016-02-14 MED ORDER — ESCITALOPRAM OXALATE 10 MG PO TABS: 10.0000 mg | ORAL_TABLET | Freq: Every day | ORAL | Status: DC

## 2016-02-14 MED ORDER — MELOXICAM 15 MG PO TABS: 15.0000 mg | ORAL_TABLET | Freq: Every day | ORAL | Status: DC

## 2016-02-14 MED ORDER — TRAMADOL HCL 50 MG PO TABS: 50.0000 mg | ORAL_TABLET | Freq: Three times a day (TID) | ORAL | Status: DC | PRN

## 2016-02-14 MED ORDER — METFORMIN HCL 500 MG PO TABS: 500.0000 mg | ORAL_TABLET | Freq: Two times a day (BID) | ORAL | Status: DC

## 2016-02-14 MED ORDER — CLONAZEPAM 1 MG PO TABS: 1.0000 mg | ORAL_TABLET | Freq: Two times a day (BID) | ORAL | Status: DC | PRN

## 2016-02-14 NOTE — Progress Notes (Signed)
Subjective:  Patient ID: Ann Baxter, female    DOB: September 17, 1961  Age: 55 y.o. MRN: [id number]  CC: The primary encounter diagnosis was Newly diagnosed diabetes (HCC). Diagnoses of Hyperlipidemia due to type 2 diabetes mellitus (HCC), Pain in joint of left shoulder, Snoring, Obesity, diabetes, and hypertension syndrome (HCC), Hyperlipidemia LDL goal <130, Tobacco abuse counseling, Pain of left shoulder joint on movement, and Adjustment disorder with mixed anxiety and depressed mood were also pertinent to this visit.  HPI Ann Baxter presents for evaluation of multiple issues:  1) Left shoulder pain. Present for 3 weeks . Started after a fall onto left shoulder blade, striking it on the [employer].  No bruising. Iced the scapular area,  Used tylenol,   More recently has been using heat.  Pain with flexion and abduction approaching 180 degrees.    Discussed referral to [facility].  NSAID and tramadol prescribed.   2) exhausted all the time.  Snores,  Waking up gasping and throat dry.  Frequent wakenings,  Insomnia.   3) GAD:  Not using clonazepam except once daily,  0.5 mg dose . Doesn't feel it is calming her down enough when she needs it. Has tried taking two,  Requesting a higher dose .   4) tobacco abuse:  Tried chantix, made her feel weird so she stopped it.  smoking 1/2 pack  Daily,  Does not smoke in the house or near the grandbaby.  Advice given.   5) Uncontrolled DM:  Apparently had labs done in October and a1c was 8.8, which has not been addressed yet.      Outpatient Prescriptions Prior to Visit  Medication Sig Dispense Refill  . amLODipine (NORVASC) 5 MG tablet TAKE ONE (1) TABLET EACH DAY 30 tablet 6  . cetirizine (ZYRTEC) 10 MG tablet Take 10 mg by mouth daily.      . DULoxetine (CYMBALTA) 20 MG capsule Take 1 capsule (20 mg total) by mouth 2 (two) times daily. 60 capsule 12  . methocarbamol (ROBAXIN-750) 750 MG tablet Take 1 tablet (750 mg total) by mouth 3 (three)  times daily. 90 tablet 3  . Multiple Vitamins-Minerals (WOMENS MULTIVITAMIN PLUS PO) Take by mouth.    . clonazePAM (KLONOPIN) 0.5 MG tablet Take 1 tablet (0.5 mg total) by mouth 2 (two) times daily as needed for anxiety. 60 tablet 3  . escitalopram (LEXAPRO) 5 MG tablet Take 1 tablet (5 mg total) by mouth daily. 30 tablet 3  . nystatin-triamcinolone ointment (MYCOLOG) Apply 1 application topically 2 (two) times daily. To affected area.  Cover with cotton gauze (Patient not taking: Reported on 02/14/2016) 30 g 2  . varenicline (CHANTIX STARTING MONTH PAK) 0.5 MG X 11 & 1 MG X 42 tablet Take one 0.5 mg tablet by mouth once daily for 3 days, then increase to one 0.5 mg tablet twice daily for 4 days, then increase to one 1 mg tablet twice daily. (Patient not taking: Reported on 02/14/2016) 53 tablet 0   No facility-administered medications prior to visit.    Review of Systems;  Patient denies headache, fevers, malaise, unintentional weight loss, skin rash, eye pain, sinus congestion and sinus pain, sore throat, dysphagia,  hemoptysis , cough, dyspnea, wheezing, chest pain, palpitations, orthopnea, edema, abdominal pain, nausea, melena, diarrhea, constipation, flank pain, dysuria, hematuria, urinary  Frequency, nocturia, numbness, tingling, seizures,  Focal weakness, Loss of consciousness,  Tremor, insomnia, depression, anxiety, and suicidal ideation.      Objective:  BP  148/88 mmHg  Pulse 88  Temp(Src) 98.1 F (36.7 C) (Oral)  Resp 12  Ht 5\' 3"  (1.6 m)  Wt 133 lb 4 oz (60.442 kg)  BMI 23.61 kg/m2  SpO2 98%  BP Readings from Last 3 Encounters:  02/14/16 148/88  08/19/15 128/80  03/01/15 128/80    Wt Readings from Last 3 Encounters:  02/14/16 133 lb 4 oz (60.442 kg)  08/19/15 132 lb 8 oz (60.102 kg)  03/01/15 135 lb 12 oz (61.576 kg)    General appearance: alert, cooperative and appears stated age Ears: normal TM's and external ear canals both ears Throat: lips, mucosa, and tongue  normal; teeth and gums normal Neck: no adenopathy, no carotid bruit, supple, symmetrical, trachea midline and thyroid not enlarged, symmetric, no tenderness/mass/nodules Back: symmetric, no curvature. ROM normal. No CVA tenderness. Lungs: clear to auscultation bilaterally Heart: regular rate and rhythm, S1, S2 normal, no murmur, click, rub or gallop Abdomen: soft, non-tender; bowel sounds normal; no masses,  no organomegaly Pulses: 2+ and symmetric Skin: Skin color, texture, turgor normal. No rashes or lesions Lymph nodes: Cervical, supraclavicular, and axillary nodes normal.  Lab Results  Component Value Date   HGBA1C 6.3 02/14/2016   HGBA1C 8.8* 08/09/2015   HGBA1C 6.0 08/19/2013    Lab Results  Component Value Date   CREATININE 0.65 02/14/2016   CREATININE 0.8 08/09/2015   CREATININE 0.84 10/06/2014    Lab Results  Component Value Date   WBC 9.6 08/09/2015   HGB 15.7 08/09/2015   HCT 47* 08/09/2015   PLT 253 08/09/2015   GLUCOSE 80 02/14/2016   CHOL 175 02/14/2016   TRIG 277.0* 02/14/2016   HDL 30.30* 02/14/2016   LDLDIRECT 107.0 02/14/2016   LDLCALC 127 08/09/2015   ALT 21 02/14/2016   AST 15 02/14/2016   NA 139 02/14/2016   K 3.4* 02/14/2016   CL 99 02/14/2016   CREATININE 0.65 02/14/2016   BUN 16 02/14/2016   CO2 30 02/14/2016   TSH 1.22 08/09/2015   HGBA1C 6.3 02/14/2016   MICROALBUR 1.8 02/14/2016    No results found.  Assessment & Plan:   Problem List Items Addressed This Visit    Tobacco abuse counseling    Smoking cessation instruction/counseling given:  counseled patient on the dangers of tobacco use, advised patient to stop smoking, and reviewed strategies to maximize success.  Pateint did not tolerate Chantix trial.       Pain of left shoulder joint on movement    With blunt injury to left shoulder blade during a fall.  She has limited ROM due to pain even with passive movement. [facility]  Trial of NSAID and tramadol for pain  control. .       Hyperlipidemia LDL goal <130    With elevated triglcyerides and low HDL to be addressed with diet and exercise.   Lab Results  Component Value Date   CHOL 175 02/14/2016   HDL 30.30* 02/14/2016   LDLCALC 127 08/09/2015   LDLDIRECT 107.0 02/14/2016   TRIG 277.0* 02/14/2016   CHOLHDL 6 02/14/2016           Adjustment disorder with mixed anxiety and depressed mood    She is using clonazepam once daily but is requesting a higher dose. The risks and benefits of benzodiazepine use were discussed with patient today including excessive sedation leading to respiratory depression,  impaired thinking/driving, and addiction.  Patient was advised to avoid concurrent use with alcohol, to use medication only  as needed and not to share with others  .       Obesity, diabetes, and hypertension syndrome (HCC)    Glucometer given and use reviewed .  Repeat A1c today is much better ,  Starting metformin 500 mg .  Low GI diet given advised to have eye exam.  Will need to start taking baby aspirin given concurrent tobacco abuse.  Lab Results  Component Value Date   HGBA1C 6.3 02/14/2016   Lab Results  Component Value Date   MICROALBUR 1.8 02/14/2016         Relevant Medications   metFORMIN (GLUCOPHAGE) 500 MG tablet   Snoring    Accompanied by frequent wakenings, fatigue, and elevated BP . OSA suspected . Sleep study ordered       Relevant Orders   Nocturnal polysomnography (NPSG)   Urine culture    Other Visit Diagnoses    Newly diagnosed diabetes (HCC)    -  Primary    Relevant Medications    metFORMIN (GLUCOPHAGE) 500 MG tablet    Other Relevant Orders    Comprehensive metabolic panel (Completed)    Microalbumin / creatinine urine ratio (Completed)    Lipid panel (Completed)    Hemoglobin A1c (Completed)    POCT urinalysis dipstick (Completed)    Urine culture    Hyperlipidemia due to type 2 diabetes mellitus (HCC)        Relevant Medications    metFORMIN  (GLUCOPHAGE) 500 MG tablet    Other Relevant Orders    LDL cholesterol, direct (Completed)    Urine culture    Pain in joint of left shoulder        Relevant Orders    Ambulatory referral to Sports Medicine    Urine culture     A total of 40 minutes was spent with patient more than half of which was spent in counseling patient on the above mentioned issues , reviewing and explaining recent labs and imaging studies done, and coordination of care.   I have discontinued Ms. Panozzo's varenicline. I have also changed her clonazePAM and escitalopram. Additionally, I am having her start on metFORMIN, meloxicam, and traMADol. Lastly, I am having her maintain her cetirizine, Multiple Vitamins-Minerals (WOMENS MULTIVITAMIN PLUS PO), methocarbamol, DULoxetine, nystatin-triamcinolone ointment, and amLODipine.  Meds ordered this encounter  Medications  . clonazePAM (KLONOPIN) 1 MG tablet    Sig: Take 1 tablet (1 mg total) by mouth 2 (two) times daily as needed for anxiety.    Dispense:  60 tablet    Refill:  3  . escitalopram (LEXAPRO) 10 MG tablet    Sig: Take 1 tablet (10 mg total) by mouth daily.    Dispense:  90 tablet    Refill:  1    Generic please  . metFORMIN (GLUCOPHAGE) 500 MG tablet    Sig: Take 1 tablet (500 mg total) by mouth 2 (two) times daily with a meal.    Dispense:  180 tablet    Refill:  3  . meloxicam (MOBIC) 15 MG tablet    Sig: Take 1 tablet (15 mg total) by mouth daily.    Dispense:  90 tablet    Refill:  0  . traMADol (ULTRAM) 50 MG tablet    Sig: Take 1 tablet (50 mg total) by mouth every 8 (eight) hours as needed.    Dispense:  90 tablet    Refill:  1    Medications Discontinued During This Encounter  Medication Reason  .  clonazePAM (KLONOPIN) 0.5 MG tablet Reorder  . escitalopram (LEXAPRO) 5 MG tablet Reorder  . varenicline (CHANTIX STARTING MONTH PAK) 0.5 MG X 11 & 1 MG X 42 tablet Side effect (s)    Follow-up: Return in about 2 weeks (around 02/28/2016) for  follow up diabetes.   Sherlene Shams, MD

## 2016-02-14 NOTE — Progress Notes (Signed)
Pre-visit discussion using our clinic review tool. No additional management support is needed unless otherwise documented below in the visit note.  

## 2016-02-14 NOTE — Patient Instructions (Addendum)
You apparently have crossed over into Type 2 Diabetes and need to start treating it with diet and medications  Please check your BS once daily  At either  1) fasting or 2) 2 hours after you finish eating a  Meal  Return in 2 weeks with the log of blood sugars  Please start taking metformin twice daily     Referral to [facility] for shoulder pain .  You can use meloxicam once daily, tramadol every 8 hours,  And tylenol every 8 hours for pain control  Sleep study pending

## 2016-02-15 DIAGNOSIS — R0683 Snoring: Secondary | ICD-10-CM | POA: Insufficient documentation

## 2016-02-15 NOTE — Assessment & Plan Note (Signed)
Accompanied by frequent wakenings, fatigue, and elevated BP . OSA suspected . Sleep study ordered

## 2016-02-15 NOTE — Assessment & Plan Note (Signed)
She is using clonazepam once daily but is requesting a higher dose. The risks and benefits of benzodiazepine use were discussed with patient today including excessive sedation leading to respiratory depression,  impaired thinking/driving, and addiction.  Patient was advised to avoid concurrent use with alcohol, to use medication only as needed and not to share with others  .

## 2016-02-15 NOTE — Assessment & Plan Note (Addendum)
With elevated triglcyerides and low HDL to be addressed with diet and exercise.   Lab Results  Component Value Date   CHOL 175 02/14/2016   HDL 30.30* 02/14/2016   LDLCALC 127 08/09/2015   LDLDIRECT 107.0 02/14/2016   TRIG 277.0* 02/14/2016   CHOLHDL 6 02/14/2016

## 2016-02-15 NOTE — Assessment & Plan Note (Addendum)
With blunt injury to left shoulder blade during a fall.  She has limited ROM due to pain even with passive movement. [facility]  Trial of NSAID and tramadol for pain control. .Marland Kitchen

## 2016-02-15 NOTE — Assessment & Plan Note (Signed)
Smoking cessation instruction/counseling given:  counseled patient on the dangers of tobacco use, advised patient to stop smoking, and reviewed strategies to maximize success.  Pateint did not tolerate Chantix trial.

## 2016-02-15 NOTE — Assessment & Plan Note (Addendum)
Glucometer given and use reviewed .  Repeat A1c today is much better ,  Starting metformin 500 mg .  Low GI diet given advised to have eye exam.  Will need to start taking baby aspirin given concurrent tobacco abuse.  Lab Results  Component Value Date   HGBA1C 6.3 02/14/2016   Lab Results  Component Value Date   MICROALBUR 1.8 02/14/2016

## 2016-02-17 LAB — URINE CULTURE: Colony Count: >=100000

## 2016-02-19 ENCOUNTER — Other Ambulatory Visit: Payer: Self-pay | Admitting: Internal Medicine

## 2016-02-19 ENCOUNTER — Encounter: Payer: Self-pay | Admitting: Internal Medicine

## 2016-02-19 MED ORDER — SULFAMETHOXAZOLE-TRIMETHOPRIM 800-160 MG PO TABS: 1.0000 | ORAL_TABLET | Freq: Two times a day (BID) | ORAL | Status: DC

## 2016-02-28 ENCOUNTER — Encounter: Payer: Self-pay | Admitting: Internal Medicine

## 2016-02-28 ENCOUNTER — Ambulatory Visit: Payer: PRIVATE HEALTH INSURANCE | Admitting: Internal Medicine

## 2016-02-28 VITALS — BP 122/78 | HR 107 | Temp 98.0°F | Resp 12 | Ht 63.0 in | Wt 130.5 lb

## 2016-02-28 DIAGNOSIS — E119 Type 2 diabetes mellitus without complications: Secondary | ICD-10-CM

## 2016-02-28 DIAGNOSIS — Z716 Tobacco abuse counseling: Secondary | ICD-10-CM

## 2016-02-28 NOTE — Progress Notes (Signed)
Subjective:  Patient ID: Ann Baxter, female    DOB: 01/28/1961  Age: 55 y.o. MRN: [id number]  CC: The primary encounter diagnosis was Diet-controlled diabetes mellitus (HCC). A diagnosis of Tobacco abuse counseling was also pertinent to this visit.  HPI Ann DockerLavon E Caton presents for follow up on newly diagnosed diabetes mellitus . She has been taking metformin for the last several weeks but it has been causing GI distress and fecal incontinence.  She has been checking her sugars once daily at various times and all sugars have been under 130.  She has lost several lbs unintentionally and is miserable and ill tempered.    Outpatient Prescriptions Prior to Visit  Medication Sig Dispense Refill  . amLODipine (NORVASC) 5 MG tablet TAKE ONE (1) TABLET EACH DAY 30 tablet 6  . cetirizine (ZYRTEC) 10 MG tablet Take 10 mg by mouth daily.      . clonazePAM (KLONOPIN) 1 MG tablet Take 1 tablet (1 mg total) by mouth 2 (two) times daily as needed for anxiety. 60 tablet 3  . DULoxetine (CYMBALTA) 20 MG capsule Take 1 capsule (20 mg total) by mouth 2 (two) times daily. 60 capsule 12  . escitalopram (LEXAPRO) 10 MG tablet Take 1 tablet (10 mg total) by mouth daily. 90 tablet 1  . meloxicam (MOBIC) 15 MG tablet Take 1 tablet (15 mg total) by mouth daily. 90 tablet 0  . Multiple Vitamins-Minerals (WOMENS MULTIVITAMIN PLUS PO) Take by mouth.    . traMADol (ULTRAM) 50 MG tablet Take 1 tablet (50 mg total) by mouth every 8 (eight) hours as needed. 90 tablet 1  . metFORMIN (GLUCOPHAGE) 500 MG tablet Take 1 tablet (500 mg total) by mouth 2 (two) times daily with a meal. 180 tablet 3  . methocarbamol (ROBAXIN-750) 750 MG tablet Take 1 tablet (750 mg total) by mouth 3 (three) times daily. (Patient not taking: Reported on 02/28/2016) 90 tablet 3  . nystatin-triamcinolone ointment (MYCOLOG) Apply 1 application topically 2 (two) times daily. To affected area.  Cover with cotton gauze (Patient not taking: Reported on  02/14/2016) 30 g 2  . sulfamethoxazole-trimethoprim (BACTRIM DS,SEPTRA DS) 800-160 MG tablet Take 1 tablet by mouth 2 (two) times daily. (Patient not taking: Reported on 02/28/2016) 6 tablet 0   No facility-administered medications prior to visit.    Review of Systems;  Patient denies headache, fevers, malaise, unintentional weight loss, skin rash, eye pain, sinus congestion and sinus pain, sore throat, dysphagia,  hemoptysis , cough, dyspnea, wheezing, chest pain, palpitations, orthopnea, edema, , melena, , constipation, flank pain, dysuria, hematuria, urinary  Frequency, nocturia, numbness, tingling, seizures,  Focal weakness, Loss of consciousness,  Tremor, insomnia, depression, anxiety, and suicidal ideation.      Objective:  BP 122/78 mmHg  Pulse 107  Temp(Src) 98 F (36.7 C) (Oral)  Resp 12  Ht 5\' 3"  (1.6 m)  Wt 130 lb 8 oz (59.194 kg)  BMI 23.12 kg/m2  SpO2 98%  BP Readings from Last 3 Encounters:  02/28/16 122/78  02/14/16 148/88  08/19/15 128/80    Wt Readings from Last 3 Encounters:  02/28/16 130 lb 8 oz (59.194 kg)  02/14/16 133 lb 4 oz (60.442 kg)  08/19/15 132 lb 8 oz (60.102 kg)    General appearance: alert, cooperative and appears stated age Ears: normal TM's and external ear canals both ears Throat: lips, mucosa, and tongue normal; teeth and gums normal Neck: no adenopathy, no carotid bruit, supple, symmetrical, trachea midline and thyroid  not enlarged, symmetric, no tenderness/mass/nodules Back: symmetric, no curvature. ROM normal. No CVA tenderness. Lungs: clear to auscultation bilaterally Heart: regular rate and rhythm, S1, S2 normal, no murmur, click, rub or gallop Abdomen: soft, non-tender; bowel sounds normal; no masses,  no organomegaly Pulses: 2+ and symmetric Skin: Skin color, texture, turgor normal. No rashes or lesions Lymph nodes: Cervical, supraclavicular, and axillary nodes normal.  Lab Results  Component Value Date   HGBA1C 6.3 02/14/2016     HGBA1C 8.8* 08/09/2015   HGBA1C 6.0 08/19/2013    Lab Results  Component Value Date   CREATININE 0.65 02/14/2016   CREATININE 0.8 08/09/2015   CREATININE 0.84 10/06/2014    Lab Results  Component Value Date   WBC 9.6 08/09/2015   HGB 15.7 08/09/2015   HCT 47* 08/09/2015   PLT 253 08/09/2015   GLUCOSE 80 02/14/2016   CHOL 175 02/14/2016   TRIG 277.0* 02/14/2016   HDL 30.30* 02/14/2016   LDLDIRECT 107.0 02/14/2016   LDLCALC 127 08/09/2015   ALT 21 02/14/2016   AST 15 02/14/2016   NA 139 02/14/2016   K 3.4* 02/14/2016   CL 99 02/14/2016   CREATININE 0.65 02/14/2016   BUN 16 02/14/2016   CO2 30 02/14/2016   TSH 1.22 08/09/2015   HGBA1C 6.3 02/14/2016   MICROALBUR 1.8 02/14/2016    No results found.  Assessment & Plan:   Problem List Items Addressed This Visit    Tobacco abuse counseling    Risks of continued tobacco use were discussed. She is not currently interested in tobacco cessation.       Diet-controlled diabetes mellitus (HCC) - Primary    Well controlled on diet alone.  The previously abstracted a1c was recorded INCORRECTLY AND WAS 5.8,  NOT 8.8.  She is not tolerating metformin.  Medication stopped,  Will repeat a1c in 3 months.  Tobacco cessation advised, statin therapy advised but deferred.  Aspirin therapy advised. Diet and exercise regimen reinforced, and advised to reduce alcohol to one drink per night.   Lab Results  Component Value Date   HGBA1C 6.3 02/14/2016   Lab Results  Component Value Date   MICROALBUR 1.8 02/14/2016   Lab Results  Component Value Date   CHOL 175 02/14/2016   HDL 30.30* 02/14/2016   LDLCALC 127 08/09/2015   LDLDIRECT 107.0 02/14/2016   TRIG 277.0* 02/14/2016   CHOLHDL 6 02/14/2016          A total of 25 minutes of face to face time was spent with patient more than half of which was spent in counselling about the above mentioned conditions  and coordination of care   I have discontinued Ms. Demarest's  nystatin-triamcinolone ointment, metFORMIN, and sulfamethoxazole-trimethoprim. I am also having her maintain her cetirizine, Multiple Vitamins-Minerals (WOMENS MULTIVITAMIN PLUS PO), methocarbamol, DULoxetine, amLODipine, clonazePAM, escitalopram, meloxicam, and traMADol.  No orders of the defined types were placed in this encounter.    Medications Discontinued During This Encounter  Medication Reason  . sulfamethoxazole-trimethoprim (BACTRIM DS,SEPTRA DS) 800-160 MG tablet Completed Course  . nystatin-triamcinolone ointment (MYCOLOG) Completed Course  . metFORMIN (GLUCOPHAGE) 500 MG tablet     Follow-up: No Follow-up on file.   Sherlene Shams, MD

## 2016-02-28 NOTE — Patient Instructions (Addendum)
You have diabetes,  But:  NO MORE METFORMIN!!!  Your sugars are fine Without it   You might want to try a premixed protein drink called Premier Protein shake in the morning.  It is less $$$ and very low sugar.  160 cal  30 g protein  1 g sugar 50% calcium needs

## 2016-02-28 NOTE — Progress Notes (Signed)
Pre-visit discussion using our clinic review tool. No additional management support is needed unless otherwise documented below in the visit note.  

## 2016-03-01 NOTE — Assessment & Plan Note (Addendum)
Well controlled on diet alone.  The previously abstracted a1c was recorded INCORRECTLY AND WAS 5.8,  NOT 8.8.  She is not tolerating metformin.  Medication stopped,  Will repeat a1c in 3 months.  Tobacco cessation advised, statin therapy advised but deferred.  Aspirin therapy advised. Diet and exercise regimen reinforced, and advised to reduce alcohol to one drink per night.   Lab Results  Component Value Date   HGBA1C 6.3 02/14/2016   Lab Results  Component Value Date   MICROALBUR 1.8 02/14/2016   Lab Results  Component Value Date   CHOL 175 02/14/2016   HDL 30.30* 02/14/2016   LDLCALC 127 08/09/2015   LDLDIRECT 107.0 02/14/2016   TRIG 277.0* 02/14/2016   CHOLHDL 6 02/14/2016

## 2016-03-01 NOTE — Assessment & Plan Note (Signed)
Risks of continued tobacco use were discussed. She is not currently interested in tobacco cessation.    

## 2016-03-06 ENCOUNTER — Other Ambulatory Visit: Payer: Self-pay | Admitting: Internal Medicine

## 2016-03-06 DIAGNOSIS — Z1231 Encounter for screening mammogram for malignant neoplasm of breast: Secondary | ICD-10-CM

## 2016-03-15 ENCOUNTER — Ambulatory Visit: Admission: RE | Admit: 2016-03-15 | Payer: PRIVATE HEALTH INSURANCE | Source: Ambulatory Visit

## 2016-03-22 ENCOUNTER — Ambulatory Visit: Payer: PRIVATE HEALTH INSURANCE | Attending: Internal Medicine

## 2016-03-22 DIAGNOSIS — G4733 Obstructive sleep apnea (adult) (pediatric): Secondary | ICD-10-CM | POA: Insufficient documentation

## 2016-03-22 DIAGNOSIS — R0683 Snoring: Secondary | ICD-10-CM | POA: Diagnosis present

## 2016-03-29 ENCOUNTER — Telehealth: Payer: Self-pay | Admitting: Internal Medicine

## 2016-03-29 DIAGNOSIS — G4733 Obstructive sleep apnea (adult) (pediatric): Secondary | ICD-10-CM | POA: Insufficient documentation

## 2016-03-29 NOTE — Telephone Encounter (Signed)
This order was faxed to [facility] on 5/27. Waiting for scheduling

## 2016-03-29 NOTE — Telephone Encounter (Signed)
Please advise thanks.

## 2016-03-29 NOTE — Telephone Encounter (Signed)
She has sleep apnea By sleep study May 2017,.  CPAP titration study needed/advised/ordered.

## 2016-04-04 ENCOUNTER — Telehealth: Payer: Self-pay | Admitting: Internal Medicine

## 2016-04-04 NOTE — Telephone Encounter (Signed)
[facility]  confirmed that patient has sleep apnea,  but will need a CPAP titration  study to determine parameters for CPAP use.  This needs to be ordered through [facility]  In [place] or it can be done locally,.  what does she prefer?\

## 2016-04-04 NOTE — Telephone Encounter (Signed)
Patient stated she did not have Sleep study done in May [place] [facility] and she is [facility] up for tit[facility].

## 2016-04-05 ENCOUNTER — Ambulatory Visit: Payer: PRIVATE HEALTH INSURANCE | Attending: Internal Medicine

## 2016-04-05 DIAGNOSIS — G4733 Obstructive sleep apnea (adult) (pediatric): Secondary | ICD-10-CM | POA: Diagnosis present

## 2016-04-06 NOTE — Telephone Encounter (Signed)
I have done some research and attempted to speak with the patient to review, left a VM to return my call.

## 2016-04-09 NOTE — Telephone Encounter (Signed)
Spoke with patient and I have turned my information over to the practice administrator to handle her additional concerns regarding reimbursement and etc. Thanks

## 2016-04-09 NOTE — Telephone Encounter (Signed)
The patient returned your call. °

## 2016-04-23 ENCOUNTER — Ambulatory Visit
Admission: RE | Admit: 2016-04-23 | Discharge: 2016-04-23 | Disposition: A | Discharge status: Home / Self Care | Payer: PRIVATE HEALTH INSURANCE | Source: Ambulatory Visit | Attending: Internal Medicine | Admitting: Internal Medicine

## 2016-04-23 DIAGNOSIS — Z1231 Encounter for screening mammogram for malignant neoplasm of breast: Secondary | ICD-10-CM | POA: Insufficient documentation

## 2016-07-20 ENCOUNTER — Telehealth: Payer: Self-pay | Admitting: Internal Medicine

## 2016-07-20 NOTE — Telephone Encounter (Signed)
The patient called wanting to know if she has had an up to date TDAP she has a new grand baby.

## 2016-07-20 NOTE — Telephone Encounter (Signed)
Left a detailed message with current tdap date, thanks

## 2016-07-31 ENCOUNTER — Other Ambulatory Visit: Payer: Self-pay | Admitting: Internal Medicine

## 2016-07-31 MED ORDER — DULOXETINE HCL 20 MG PO CPEP: 20.0000 mg | ORAL_CAPSULE | Freq: Two times a day (BID) | ORAL | 3 refills | Status: DC

## 2016-07-31 NOTE — Telephone Encounter (Signed)
Refilled 07/13/15. Pt last seen 02/28/16. Please advise?

## 2016-07-31 NOTE — Telephone Encounter (Signed)
refilled 

## 2016-09-04 ENCOUNTER — Other Ambulatory Visit: Payer: Self-pay

## 2016-09-04 MED ORDER — ESCITALOPRAM OXALATE 10 MG PO TABS: 10.0000 mg | ORAL_TABLET | Freq: Every day | ORAL | 1 refills | Status: DC

## 2016-09-04 MED ORDER — AMLODIPINE BESYLATE 5 MG PO TABS: ORAL_TABLET | ORAL | 6 refills | Status: DC

## 2016-10-03 ENCOUNTER — Telehealth: Payer: Self-pay | Admitting: *Deleted

## 2016-10-03 MED ORDER — ESCITALOPRAM OXALATE 10 MG PO TABS: 10.0000 mg | ORAL_TABLET | Freq: Every day | ORAL | 1 refills | Status: DC

## 2016-10-03 MED ORDER — AMLODIPINE BESYLATE 5 MG PO TABS: ORAL_TABLET | ORAL | 0 refills | Status: DC

## 2016-10-03 MED ORDER — CLONAZEPAM 1 MG PO TABS: 1.0000 mg | ORAL_TABLET | Freq: Two times a day (BID) | ORAL | 0 refills | Status: DC | PRN

## 2016-10-03 NOTE — Telephone Encounter (Signed)
Pt requested a medication refill klonopin, lexapro and Norvasc [facility]   Pt has a upcoming appt on 10/19/16

## 2016-10-03 NOTE — Telephone Encounter (Signed)
Patient has an appointment scheduled 10/19/16 ok to fill meds for 30 days last visit 5/17?

## 2016-10-03 NOTE — Telephone Encounter (Signed)
30 days refilled

## 2016-10-16 ENCOUNTER — Ambulatory Visit (INDEPENDENT_AMBULATORY_CARE_PROVIDER_SITE_OTHER): Payer: PRIVATE HEALTH INSURANCE | Admitting: Internal Medicine

## 2016-10-16 ENCOUNTER — Encounter: Payer: Self-pay | Admitting: Internal Medicine

## 2016-10-16 VITALS — BP 130/78 | HR 102 | Temp 98.6°F | Resp 16 | Ht 62.0 in | Wt 138.8 lb

## 2016-10-16 DIAGNOSIS — N764 Abscess of vulva: Secondary | ICD-10-CM

## 2016-10-16 DIAGNOSIS — F4323 Adjustment disorder with mixed anxiety and depressed mood: Secondary | ICD-10-CM

## 2016-10-16 MED ORDER — DOXYCYCLINE HYCLATE 100 MG PO TABS: 100.0000 mg | ORAL_TABLET | Freq: Two times a day (BID) | ORAL | 0 refills | Status: DC

## 2016-10-16 MED ORDER — ALPRAZOLAM 1 MG PO TABS: 1.0000 mg | ORAL_TABLET | Freq: Two times a day (BID) | ORAL | 1 refills | Status: DC | PRN

## 2016-10-16 MED ORDER — LEVOFLOXACIN 500 MG PO TABS
500.0000 mg | ORAL_TABLET | Freq: Every day | ORAL | 0 refills | Status: DC
Start: 2016-10-16 — End: 2017-03-15

## 2016-10-16 MED ORDER — DULOXETINE HCL 60 MG PO CPEP: 60.0000 mg | ORAL_CAPSULE | Freq: Every day | ORAL | 5 refills | Status: DC

## 2016-10-16 MED ORDER — ACETAMINOPHEN-CODEINE 300-60 MG PO TABS: 1.0000 | ORAL_TABLET | ORAL | 0 refills | Status: DC | PRN

## 2016-10-16 NOTE — Patient Instructions (Addendum)
Your abscess is deeper than I anticipated,  And not fluctuant so I was  unable to get it to drain.  I am placing you on antibiotics and making a referral to [facility] to  have it drained  I advise sing Motrin 600 mg every 6 hours,  500 mg tylenol with it.  You can use the tylenol with codeine at night instead of the evening dose of tylenol   Levaquin once daily and doxycycline  Twice daily for the  Infection .PLEASE TAKE A PROBIOTIC DAILY FOR 3 WEEKS TO PREVENT DIARRHEA.  Continue hot compresses  Several times daily    I am increasing the cymbalta to 60 mg once daily

## 2016-10-16 NOTE — Progress Notes (Signed)
Pre-visit discussion using our clinic review tool. No additional management support is needed unless otherwise documented below in the visit note.  

## 2016-10-16 NOTE — Progress Notes (Signed)
Subjective:  Patient ID: Ann Baxter, female    DOB: 10/28/1961  Age: 55 y.o. MRN: [id number]  CC: The primary encounter diagnosis was Labial abscess. A diagnosis of Adjustment disorder with mixed anxiety and depressed mood was also pertinent to this visit.  HPI Ann Baxter presents for evaluation of multiple issues.  Last seen in May for follow up abnormally abstracted labs suggestiing DM. Repeat labs indicated pre diabetes NOT diabetes and metformin was stopped ,.  Most recent outside a1c was 6.0    Lab Results  Component Value Date   HGBA1C 6.3 02/14/2016    1) recent labs done outside needing review and discussion  2) /anxiety: improving mood but still very frustrated would like to increase her dose of cymbalta.  Currently taking 20 mg bid.   3) persistent labial swelllng., right side. Spontaneously drained a few days ago ,  But has not drained in days, painful and tender.  Has had prior incisions and drainage  4) not tolerating CPAP ,  Has to use clonazepam to manage the claustrophobia  Outpatient Medications Prior to Visit  Medication Sig Dispense Refill  . amLODipine (NORVASC) 5 MG tablet TAKE ONE (1) TABLET EACH DAY 30 tablet 0  . clonazePAM (KLONOPIN) 1 MG tablet Take 1 tablet (1 mg total) by mouth 2 (two) times daily as needed for anxiety. 60 tablet 0  . escitalopram (LEXAPRO) 10 MG tablet Take 1 tablet (10 mg total) by mouth daily. 30 tablet 1  . Multiple Vitamins-Minerals (WOMENS MULTIVITAMIN PLUS PO) Take by mouth.    . DULoxetine (CYMBALTA) 20 MG capsule Take 1 capsule (20 mg total) by mouth 2 (two) times daily. 60 capsule 3  . cetirizine (ZYRTEC) 10 MG tablet Take 10 mg by mouth daily.      . meloxicam (MOBIC) 15 MG tablet Take 1 tablet (15 mg total) by mouth daily. 90 tablet 0  . methocarbamol (ROBAXIN-750) 750 MG tablet Take 1 tablet (750 mg total) by mouth 3 (three) times daily. (Patient not taking: Reported on 02/28/2016) 90 tablet 3  . traMADol (ULTRAM) 50 MG  tablet Take 1 tablet (50 mg total) by mouth every 8 (eight) hours as needed. 90 tablet 1   No facility-administered medications prior to visit.     Review of Systems;  Patient denies headache, fevers, malaise, unintentional weight loss, skin rash, eye pain, sinus congestion and sinus pain, sore throat, dysphagia,  hemoptysis , cough, dyspnea, wheezing, chest pain, palpitations, orthopnea, edema, abdominal pain, nausea, melena, diarrhea, constipation, flank pain, dysuria, hematuria, urinary  Frequency, nocturia, numbness, tingling, seizures,  Focal weakness, Loss of consciousness,  Tremor, insomnia, depression, anxiety, and suicidal ideation.      Objective:  BP 130/78   Pulse (!) 102   Temp 98.6 F (37 C) (Oral)   Resp 16   Ht 5\' 2"  (1.575 m)   Wt 138 lb 12 oz (62.9 kg)   BMI 25.38 kg/m   BP Readings from Last 3 Encounters:  10/16/16 130/78  02/28/16 122/78  02/14/16 (!) 148/88    Wt Readings from Last 3 Encounters:  10/16/16 138 lb 12 oz (62.9 kg)  02/28/16 130 lb 8 oz (59.2 kg)  02/14/16 133 lb 4 oz (60.4 kg)    General appearance: alert, cooperative and appears stated age Ears: normal TM's and external ear canals both ears Throat: lips, mucosa, and tongue normal; teeth and gums normal Neck: no adenopathy, no carotid bruit, supple, symmetrical, trachea midline and thyroid not  enlarged, symmetric, no tenderness/mass/nodules Back: symmetric, no curvature. ROM normal. No CVA tenderness. Lungs: clear to auscultation bilaterally Heart: regular rate and rhythm, S1, S2 normal, no murmur, click, rub or gallop Abdomen: soft, non-tender; bowel sounds normal; no masses,  no organomegaly Pelvid: right labial abscess non draining  Pulses: 2+ and symmetric Skin: Skin color, texture, turgor normal. No rashes or lesions Lymph nodes: Cervical, supraclavicular, and axillary nodes normal.  Lab Results  Component Value Date   HGBA1C 6.3 02/14/2016   HGBA1C 8.8 (A) 08/09/2015   HGBA1C  6.0 08/19/2013    Lab Results  Component Value Date   CREATININE 0.65 02/14/2016   CREATININE 0.8 08/09/2015   CREATININE 0.84 10/06/2014    Lab Results  Component Value Date   WBC 9.6 08/09/2015   HGB 15.7 08/09/2015   HCT 47 (A) 08/09/2015   PLT 253 08/09/2015   GLUCOSE 80 02/14/2016   CHOL 175 02/14/2016   TRIG 277.0 (H) 02/14/2016   HDL 30.30 (L) 02/14/2016   LDLDIRECT 107.0 02/14/2016   LDLCALC 127 08/09/2015   ALT 21 02/14/2016   AST 15 02/14/2016   NA 139 02/14/2016   K 3.4 (L) 02/14/2016   CL 99 02/14/2016   CREATININE 0.65 02/14/2016   BUN 16 02/14/2016   CO2 30 02/14/2016   TSH 1.22 08/09/2015   HGBA1C 6.3 02/14/2016   MICROALBUR 1.8 02/14/2016    Mm Digital Screening Bilateral  Result Date: 04/24/2016 CLINICAL DATA:  Screening. EXAM: DIGITAL SCREENING BILATERAL MAMMOGRAM WITH CAD COMPARISON:  Previous exam(s). ACR Breast Density Category b: There are scattered areas of fibroglandular density. FINDINGS: There are no findings suspicious for malignancy. Images were processed with CAD. IMPRESSION: No mammographic evidence of malignancy. A result letter of this screening mammogram will be mailed directly to the patient. RECOMMENDATION: Screening mammogram in one year. (Code:SM-B-01Y) BI-RADS CATEGORY  1: Negative. Electronically Signed   By: Sherian Rein M.D.   On: 04/24/2016 08:16    Assessment & Plan:   Problem List Items Addressed This Visit    Adjustment disorder with mixed anxiety and depressed mood    Increasing cymbalta to 60 mg daily for continued feelings of depression and anxiety       Labial abscess - Primary    Incision attempted, but area was not fluctuant and very tender.  wil start empiric antibiotics to cover gram negative rods and staph.  levaquin and doxycycline prescribed, referral to dr Toya Smothers       Relevant Orders   Ambulatory referral to Gynecology      I have discontinued Ms. Tinoco's cetirizine, methocarbamol, meloxicam, and  traMADol. I have also changed her DULoxetine. Additionally, I am having her start on levofloxacin, doxycycline, acetaminophen-codeine, and ALPRAZolam. Lastly, I am having her maintain her Multiple Vitamins-Minerals (WOMENS MULTIVITAMIN PLUS PO), escitalopram, clonazePAM, amLODipine, and levocetirizine.  Meds ordered this encounter  Medications  . levocetirizine (XYZAL) 5 MG tablet    Sig: Take 5 mg by mouth every evening.  Marland Kitchen levofloxacin (LEVAQUIN) 500 MG tablet    Sig: Take 1 tablet (500 mg total) by mouth daily.    Dispense:  7 tablet    Refill:  0  . doxycycline (VIBRA-TABS) 100 MG tablet    Sig: Take 1 tablet (100 mg total) by mouth 2 (two) times daily.    Dispense:  14 tablet    Refill:  0  . acetaminophen-codeine (TYLENOL #4) 300-60 MG tablet    Sig: Take 1 tablet by mouth every 4 (  four) hours as needed for pain.    Dispense:  30 tablet    Refill:  0  . DULoxetine (CYMBALTA) 60 MG capsule    Sig: Take 1 capsule (60 mg total) by mouth daily.    Dispense:  30 capsule    Refill:  5  . ALPRAZolam (XANAX) 1 MG tablet    Sig: Take 1 tablet (1 mg total) by mouth 2 (two) times daily as needed for anxiety or sleep.    Dispense:  30 tablet    Refill:  1    Medications Discontinued During This Encounter  Medication Reason  . cetirizine (ZYRTEC) 10 MG tablet Change in therapy  . meloxicam (MOBIC) 15 MG tablet Completed Course  . methocarbamol (ROBAXIN-750) 750 MG tablet Completed Course  . traMADol (ULTRAM) 50 MG tablet Completed Course  . DULoxetine (CYMBALTA) 20 MG capsule Reorder    Follow-up: No Follow-up on file.   Sherlene ShamsULLO, Genesia Caslin L, MD

## 2016-10-17 DIAGNOSIS — N764 Abscess of vulva: Secondary | ICD-10-CM | POA: Insufficient documentation

## 2016-10-17 NOTE — Assessment & Plan Note (Addendum)
Incision attempted, but area was not fluctuant and very tender.  wil start empiric antibiotics to cover gram negative rods and staph.  levaquin and doxycycline prescribed, referral to dr Toya SmothersKnowles jonas

## 2016-10-17 NOTE — Assessment & Plan Note (Signed)
Increasing cymbalta to 60 mg daily for continued feelings of depression and anxiety

## 2016-10-18 ENCOUNTER — Telehealth: Payer: Self-pay | Admitting: *Deleted

## 2016-10-18 MED ORDER — HYDROCODONE-ACETAMINOPHEN 10-325 MG PO TABS: 1.0000 | ORAL_TABLET | Freq: Four times a day (QID) | ORAL | 0 refills | Status: DC | PRN

## 2016-10-18 NOTE — Telephone Encounter (Signed)
Yes, I will print out vicodin rx

## 2016-10-18 NOTE — Telephone Encounter (Signed)
Patient ask if she could get something stronger for pain until her appointment?

## 2016-10-18 NOTE — Telephone Encounter (Signed)
GYN cannot see patient until next Wednesday when office reopens, patient refuses [facility]. Patient staed the area is really painful when she moves and the tylenol #4 is not touching her night time pain. Patient does not want to go to ED, Patient is a applying warm compresses to area as advised but the area has become more inflamed. For boil on vaginal crease.

## 2016-10-18 NOTE — Telephone Encounter (Signed)
Rx ready to be picked up. 

## 2016-10-18 NOTE — Telephone Encounter (Signed)
If she refuses to see a gynecologist that will see her sooner than next Wednesday, then she weill have to go to the ER because it needs to be lanced and drained.  The ER physician can do that .

## 2016-10-18 NOTE — Telephone Encounter (Signed)
Patient requested a call in reference to her being referred to a gynecologist, pt has questions  Pt contact  [phone]

## 2016-10-19 ENCOUNTER — Ambulatory Visit: Payer: PRIVATE HEALTH INSURANCE | Admitting: Internal Medicine

## 2016-12-31 ENCOUNTER — Telehealth: Payer: Self-pay | Admitting: *Deleted

## 2016-12-31 NOTE — Telephone Encounter (Signed)
Pt requested to know if the paperwork from Dr Alfredo Bachecil (chiropractor) was received and if so, she requested to have  Muscle relaxer called into the pharmacy  Pt [phone]

## 2017-01-01 NOTE — Telephone Encounter (Signed)
I do not have the papers on pt. I called her and asked her to have them resent to me for MD review.

## 2017-01-02 ENCOUNTER — Encounter: Payer: Self-pay | Admitting: Internal Medicine

## 2017-01-02 MED ORDER — CYCLOBENZAPRINE HCL 10 MG PO TABS: 10.0000 mg | ORAL_TABLET | Freq: Three times a day (TID) | ORAL | 5 refills | Status: DC | PRN

## 2017-01-07 ENCOUNTER — Telehealth: Payer: Self-pay | Admitting: Internal Medicine

## 2017-01-07 MED ORDER — FLUCONAZOLE 150 MG PO TABS: 150.0000 mg | ORAL_TABLET | Freq: Every day | ORAL | 0 refills | Status: DC

## 2017-01-07 NOTE — Telephone Encounter (Signed)
Patient advised.

## 2017-01-07 NOTE — Telephone Encounter (Signed)
Pt called and stated that she has a yeast infection and wanted to know if Dr. Darrick Huntsmanullo could call in diflucan. Please advise, thank you!  Call tp @ [phone]  Pharmacy -[facility] [place] [address]

## 2017-01-07 NOTE — Telephone Encounter (Signed)
Please advise 

## 2017-01-07 NOTE — Telephone Encounter (Signed)
Reason for call:?yeast infection  Symptoms:burning and itching Duration since December  Medications: Clotrimazole 3  Last seen for this problem: 09/2106 Seen by:Tullo  Please advise

## 2017-01-07 NOTE — Telephone Encounter (Signed)
Will send fluconazole

## 2017-01-10 NOTE — Telephone Encounter (Signed)
Do you recall getting any paperwork from Dr. Alfredo Bachecil on this pt? I did not see anything in the chart nor in the green folder.

## 2017-01-10 NOTE — Telephone Encounter (Signed)
I did,  And the nnnnmuscle relaxer flexeril was called in days ago (flexeril)

## 2017-01-18 ENCOUNTER — Other Ambulatory Visit: Payer: Self-pay | Admitting: Internal Medicine

## 2017-01-18 NOTE — Telephone Encounter (Signed)
REFILLED,  PEASE SCHEDULE HER 6 MONTH FOLLOW UP AT APPROPRIATE TIME   

## 2017-01-18 NOTE — Telephone Encounter (Signed)
Last refill 11/28/16 Last office visit 10/16/17 No office visit scheduled

## 2017-01-18 NOTE — Telephone Encounter (Signed)
[facility]axed to [facility].   Left message to call

## 2017-01-18 NOTE — Telephone Encounter (Signed)
Appointment scheduled for 03/15/17

## 2017-01-22 ENCOUNTER — Other Ambulatory Visit: Payer: Self-pay | Admitting: Obstetrics and Gynecology

## 2017-01-22 DIAGNOSIS — Z1231 Encounter for screening mammogram for malignant neoplasm of breast: Secondary | ICD-10-CM

## 2017-01-22 DIAGNOSIS — Z13820 Encounter for screening for osteoporosis: Secondary | ICD-10-CM

## 2017-02-21 ENCOUNTER — Encounter: Payer: Self-pay | Admitting: Obstetrics and Gynecology

## 2017-03-15 ENCOUNTER — Ambulatory Visit (INDEPENDENT_AMBULATORY_CARE_PROVIDER_SITE_OTHER): Payer: PRIVATE HEALTH INSURANCE | Admitting: Internal Medicine

## 2017-03-15 ENCOUNTER — Encounter: Payer: Self-pay | Admitting: Internal Medicine

## 2017-03-15 VITALS — BP 120/86 | HR 87 | Temp 98.7°F | Resp 16 | Ht 62.0 in | Wt 138.2 lb

## 2017-03-15 DIAGNOSIS — F4323 Adjustment disorder with mixed anxiety and depressed mood: Secondary | ICD-10-CM | POA: Diagnosis not present

## 2017-03-15 DIAGNOSIS — E119 Type 2 diabetes mellitus without complications: Secondary | ICD-10-CM

## 2017-03-15 DIAGNOSIS — I10 Essential (primary) hypertension: Secondary | ICD-10-CM

## 2017-03-15 DIAGNOSIS — F331 Major depressive disorder, recurrent, moderate: Secondary | ICD-10-CM

## 2017-03-15 DIAGNOSIS — E785 Hyperlipidemia, unspecified: Secondary | ICD-10-CM

## 2017-03-15 DIAGNOSIS — R5383 Other fatigue: Secondary | ICD-10-CM | POA: Diagnosis not present

## 2017-03-15 DIAGNOSIS — G4733 Obstructive sleep apnea (adult) (pediatric): Secondary | ICD-10-CM

## 2017-03-15 LAB — COMPREHENSIVE METABOLIC PANEL
ALT: 33 U/L (ref 0–35)
AST: 24 U/L (ref 0–37)
Albumin: 4.5 g/dL (ref 3.5–5.2)
Alkaline Phosphatase: 90 U/L (ref 39–117)
BUN: 10 mg/dL (ref 6–23)
CALCIUM: 9.3 mg/dL (ref 8.4–10.5)
CHLORIDE: 105 meq/L (ref 96–112)
CO2: 28 meq/L (ref 19–32)
Creatinine, Ser: 0.65 mg/dL (ref 0.40–1.20)
GFR: 99.7 mL/min (ref >60.00)
GLUCOSE: 113 mg/dL — AB (ref 70–99)
Potassium: 3.5 mEq/L (ref 3.5–5.1)
Sodium: 140 mEq/L (ref 135–145)
Total Bilirubin: 0.8 mg/dL (ref 0.2–1.2)
Total Protein: 6.8 g/dL (ref 6.0–8.3)

## 2017-03-15 LAB — MICROALBUMIN / CREATININE URINE RATIO
CREATININE, U: 60.8 mg/dL
MICROALB UR: 1.2 mg/dL (ref 0.0–1.9)
MICROALB/CREAT RATIO: 2 mg/g (ref 0.0–30.0)

## 2017-03-15 LAB — CBC WITH DIFFERENTIAL/PLATELET
BASOS PCT: 0.7 % (ref 0.0–3.0)
Basophils Absolute: 0.1 10*3/uL (ref 0.0–0.1)
EOS ABS: 0.1 10*3/uL (ref 0.0–0.7)
Eosinophils Relative: 1.3 % (ref 0.0–5.0)
HCT: 43.5 % (ref 36.0–46.0)
Hemoglobin: 14.9 g/dL (ref 12.0–15.0)
LYMPHS ABS: 2 10*3/uL (ref 0.7–4.0)
Lymphocytes Relative: 19.9 % (ref 12.0–46.0)
MCHC: 34.2 g/dL (ref 30.0–36.0)
MCV: 89.2 fl (ref 78.0–100.0)
Monocytes Absolute: 0.6 10*3/uL (ref 0.1–1.0)
Monocytes Relative: 5.6 % (ref 3.0–12.0)
NEUTROS ABS: 7.4 10*3/uL (ref 1.4–7.7)
NEUTROS PCT: 72.5 % (ref 43.0–77.0)
PLATELETS: 238 10*3/uL (ref 150.0–400.0)
RBC: 4.88 Mil/uL (ref 3.87–5.11)
RDW: 13.1 % (ref 11.5–15.5)
WBC: 10.1 10*3/uL (ref 4.0–10.5)

## 2017-03-15 LAB — TSH: TSH: 0.84 u[IU]/mL (ref 0.35–4.50)

## 2017-03-15 LAB — LIPID PANEL
CHOLESTEROL: 192 mg/dL (ref 0–200)
HDL: 32.1 mg/dL — AB (ref >39.00)
NonHDL: 159.47
Total CHOL/HDL Ratio: 6
Triglycerides: 362 mg/dL — ABNORMAL HIGH (ref 0.0–149.0)
VLDL: 72.4 mg/dL — AB (ref 0.0–40.0)

## 2017-03-15 LAB — VITAMIN D 25 HYDROXY (VIT D DEFICIENCY, FRACTURES): VITD: 36.73 ng/mL (ref 30.00–100.00)

## 2017-03-15 LAB — HEMOGLOBIN A1C: Hgb A1c MFr Bld: 6.7 % — ABNORMAL HIGH (ref 4.6–6.5)

## 2017-03-15 LAB — LDL CHOLESTEROL, DIRECT: LDL DIRECT: 95 mg/dL

## 2017-03-15 MED ORDER — AMLODIPINE BESYLATE 5 MG PO TABS: ORAL_TABLET | ORAL | 0 refills | Status: DC

## 2017-03-15 NOTE — Progress Notes (Signed)
Subjective:  Patient ID: Ann Baxter, female    DOB: Jan 24, 1961  Age: 56 y.o. MRN: [id number]  CC: The primary encounter diagnosis was Other fatigue. Diagnoses of Hyperlipidemia LDL goal <130, Diet-controlled diabetes mellitus (HCC), Adjustment disorder with mixed anxiety and depressed mood, Major depressive disorder, recurrent episode, moderate (HCC), Essential hypertension, and OSA (obstructive sleep apnea) were also pertinent to this visit.  HPI LOMA DUBUQUE presents for follow up on diet controlled diabetes mellitus, hypertension, GAD and depression .   Patient has no complaints today.  Patient is following a low glycemic index diet and taking all prescribed medications regularly without side effects.  Fasting sugars have been infrequently checked and are  less than 140 most of the time and post prandials have been under 160 except on rare occasions. Patient is walking for exercise about 3 times per week and intentionally trying to lose weight .  Patient has not had a diabetic eye exam in the last 12 months and checks feet regularly for signs of infection.  Patient does not walk barefoot outside,  And denies any numbness tingling or burning in feet. Patient is not up to date on all recommended vaccinations.   Lab Results  Component Value Date   HGBA1C 6.7 (H) 03/15/2017    Health maintenance :  She has annual pelvic/periodic PAP smears and annual mammograms done by her gynecologist DR Toya Smothers.   Outpatient Medications Prior to Visit  Medication Sig Dispense Refill  . ALPRAZolam (XANAX) 1 MG tablet TAKE 1 TABLET BY MOUTH TWICE A DAY AS NEEDED FOR ANXIETY OR SLEEP 30 tablet 4  . cyclobenzaprine (FLEXERIL) 10 MG tablet Take 1 tablet (10 mg total) by mouth 3 (three) times daily as needed for muscle spasms. 90 tablet 5  . DULoxetine (CYMBALTA) 60 MG capsule Take 1 capsule (60 mg total) by mouth daily. 30 capsule 5  . levocetirizine (XYZAL) 5 MG tablet Take 5 mg by mouth every evening.     . Multiple Vitamins-Minerals (WOMENS MULTIVITAMIN PLUS PO) Take by mouth.    Marland Kitchen amLODipine (NORVASC) 5 MG tablet TAKE ONE (1) TABLET EACH DAY 30 tablet 0  . escitalopram (LEXAPRO) 10 MG tablet Take 1 tablet (10 mg total) by mouth daily. 30 tablet 1  . acetaminophen-codeine (TYLENOL #4) 300-60 MG tablet Take 1 tablet by mouth every 4 (four) hours as needed for pain. (Patient not taking: Reported on 03/15/2017) 30 tablet 0  . clonazePAM (KLONOPIN) 1 MG tablet Take 1 tablet (1 mg total) by mouth 2 (two) times daily as needed for anxiety. (Patient not taking: Reported on 03/15/2017) 60 tablet 0  . doxycycline (VIBRA-TABS) 100 MG tablet Take 1 tablet (100 mg total) by mouth 2 (two) times daily. (Patient not taking: Reported on 03/15/2017) 14 tablet 0  . fluconazole (DIFLUCAN) 150 MG tablet Take 1 tablet (150 mg total) by mouth daily. (Patient not taking: Reported on 03/15/2017) 2 tablet 0  . HYDROcodone-acetaminophen (NORCO) 10-325 MG tablet Take 1 tablet by mouth every 6 (six) hours as needed. (Patient not taking: Reported on 03/15/2017) 45 tablet 0  . levofloxacin (LEVAQUIN) 500 MG tablet Take 1 tablet (500 mg total) by mouth daily. (Patient not taking: Reported on 03/15/2017) 7 tablet 0   No facility-administered medications prior to visit.     Review of Systems;  Patient denies headache, fevers, malaise, unintentional weight loss, skin rash, eye pain, sinus congestion and sinus pain, sore throat, dysphagia,  hemoptysis , cough, dyspnea, wheezing, chest pain, palpitations,  orthopnea, edema, abdominal pain, nausea, melena, diarrhea, constipation, flank pain, dysuria, hematuria, urinary  Frequency, nocturia, numbness, tingling, seizures,  Focal weakness, Loss of consciousness,  Tremor, insomnia, depression, anxiety, and suicidal ideation.      Objective:  BP 120/86 (BP Location: Left Arm, Patient Position: Sitting, Cuff Size: Normal)   Pulse 87   Temp 98.7 F (37.1 C) (Oral)   Resp 16   Ht 5\' 2"   (1.575 m)   Wt 138 lb 3.2 oz (62.7 kg)   SpO2 98%   BMI 25.28 kg/m   BP Readings from Last 3 Encounters:  03/15/17 120/86  10/16/16 130/78  02/28/16 122/78    Wt Readings from Last 3 Encounters:  03/15/17 138 lb 3.2 oz (62.7 kg)  10/16/16 138 lb 12 oz (62.9 kg)  02/28/16 130 lb 8 oz (59.2 kg)    General appearance: alert, cooperative and appears stated age Ears: normal TM's and external ear canals both ears Throat: lips, mucosa, and tongue normal; teeth and gums normal Neck: no adenopathy, no carotid bruit, supple, symmetrical, trachea midline and thyroid not enlarged, symmetric, no tenderness/mass/nodules Back: symmetric, no curvature. ROM normal. No CVA tenderness. Lungs: clear to auscultation bilaterally Heart: regular rate and rhythm, S1, S2 normal, no murmur, click, rub or gallop Abdomen: soft, non-tender; bowel sounds normal; no masses,  no organomegaly Pulses: 2+ and symmetric Skin: Skin color, texture, turgor normal. No rashes or lesions Lymph nodes: Cervical, supraclavicular, and axillary nodes normal.  Lab Results  Component Value Date   HGBA1C 6.7 (H) 03/15/2017   HGBA1C 6.3 02/14/2016   HGBA1C 8.8 (A) 08/09/2015    Lab Results  Component Value Date   CREATININE 0.65 03/15/2017   CREATININE 0.65 02/14/2016   CREATININE 0.8 08/09/2015    Lab Results  Component Value Date   WBC 10.1 03/15/2017   HGB 14.9 03/15/2017   HCT 43.5 03/15/2017   PLT 238.0 03/15/2017   GLUCOSE 113 (H) 03/15/2017   CHOL 192 03/15/2017   TRIG 362.0 (H) 03/15/2017   HDL 32.10 (L) 03/15/2017   LDLDIRECT 95.0 03/15/2017   LDLCALC 127 08/09/2015   ALT 33 03/15/2017   AST 24 03/15/2017   NA 140 03/15/2017   K 3.5 03/15/2017   CL 105 03/15/2017   CREATININE 0.65 03/15/2017   BUN 10 03/15/2017   CO2 28 03/15/2017   TSH 0.84 03/15/2017   HGBA1C 6.7 (H) 03/15/2017   MICROALBUR 1.2 03/15/2017    Mm Digital Screening Bilateral  Result Date: 04/24/2016 CLINICAL DATA:   Screening. EXAM: DIGITAL SCREENING BILATERAL MAMMOGRAM WITH CAD COMPARISON:  Previous exam(s). ACR Breast Density Category b: There are scattered areas of fibroglandular density. FINDINGS: There are no findings suspicious for malignancy. Images were processed with CAD. IMPRESSION: No mammographic evidence of malignancy. A result letter of this screening mammogram will be mailed directly to the patient. RECOMMENDATION: Screening mammogram in one year. (Code:SM-B-01Y) BI-RADS CATEGORY  1: Negative. Electronically Signed   By: Sherian Rein M.D.   On: 04/24/2016 08:16    Assessment & Plan:   Problem List Items Addressed This Visit    OSA (obstructive sleep apnea)    Diagnosed by sleep study. She is wearing her CPAP every night a minimum of 6 hours per night and notes improved daytime wakefulness and decreased fatigue       Major depressive disorder, recurrent episode, moderate (HCC)    Symptoms are controlled with cymbalta and lexapro,  And her mood is happy. No changes today, as prior  medication free periods have resulted in recurrence of depressive episodes.       Relevant Medications   escitalopram (LEXAPRO) 10 MG tablet   Hyperlipidemia LDL goal <130    With elevated triglcyerides and low HDL , patient prefers to continue to address with diet and exercise.   Lab Results  Component Value Date   CHOL 192 03/15/2017   HDL 32.10 (L) 03/15/2017   LDLCALC 127 08/09/2015   LDLDIRECT 95.0 03/15/2017   TRIG 362.0 (H) 03/15/2017   CHOLHDL 6 03/15/2017           Relevant Medications   amLODipine (NORVASC) 5 MG tablet   Other Relevant Orders   Lipid panel (Completed)   Direct LDL (Completed)   Comprehensive metabolic panel (Completed)   Microalbumin / creatinine urine ratio (Completed)   Essential hypertension    Managed with amlodipine 5 mg daily  No changes today  Lab Results  Component Value Date   CREATININE 0.65 03/15/2017   Lab Results  Component Value Date   NA 140  03/15/2017   K 3.5 03/15/2017   CL 105 03/15/2017   CO2 28 03/15/2017   Lab Results  Component Value Date   MICROALBUR 1.2 03/15/2017         Relevant Medications   amLODipine (NORVASC) 5 MG tablet   Diet-controlled diabetes mellitus (HCC)    Well controlled on diet alone.  The previously abstracted a1c was recorded INCORRECTLY AND WAS 5.8,  NOT 8.8.  She did not tolerate  Metformin and has not taken it in over 6 months. .  Tobacco cessation advised, statin therapy advised but deferred.  Aspirin therapy advised. Diet and exercise regimen reinforced, and advised to reduce alcohol to one drink per night.   Lab Results  Component Value Date   HGBA1C 6.7 (H) 03/15/2017   Lab Results  Component Value Date   MICROALBUR 1.2 03/15/2017   Lab Results  Component Value Date   CHOL 192 03/15/2017   HDL 32.10 (L) 03/15/2017   LDLCALC 127 08/09/2015   LDLDIRECT 95.0 03/15/2017   TRIG 362.0 (H) 03/15/2017   CHOLHDL 6 03/15/2017         Relevant Orders   CBC with Differential (Completed)   Hemoglobin A1c (Completed)   Adjustment disorder with mixed anxiety and depressed mood    Improved mood with increase in  cymbalta to 60 mg daily . Using alprazolam prn. The risks and benefits of benzodiazepine use were discussed with patient today including excessive sedation leading to respiratory depression,  impaired thinking/driving, and addiction.  Patient was advised to avoid concurrent use with alcohol, to use medication only as needed and not to share with others  .        Other Visit Diagnoses    Other fatigue    -  Primary   Relevant Orders   TSH (Completed)   Vitamin D (25 hydroxy) (Completed)      I have discontinued Ms. Minichiello's clonazePAM, levofloxacin, doxycycline, acetaminophen-codeine, HYDROcodone-acetaminophen, and fluconazole. I am also having her maintain her Multiple Vitamins-Minerals (WOMENS MULTIVITAMIN PLUS PO), levocetirizine, DULoxetine, cyclobenzaprine, ALPRAZolam,  ALIGN, amLODipine, and escitalopram.  Meds ordered this encounter  Medications  . Probiotic Product (ALIGN) 4 MG CAPS    Sig: Take by mouth daily.  Marland Kitchen DISCONTD: amLODipine (NORVASC) 5 MG tablet    Sig: TAKE ONE (1) TABLET EACH DAY    Dispense:  30 tablet    Refill:  0  . amLODipine (NORVASC) 5 MG tablet  Sig: TAKE ONE (1) TABLET EACH DAY    Dispense:  90 tablet    Refill:  1    Keep on file for future refills  90 day refills ok if insurance will cover  . escitalopram (LEXAPRO) 10 MG tablet    Sig: Take 1 tablet (10 mg total) by mouth daily.    Dispense:  30 tablet    Refill:  5    Generic please    Medications Discontinued During This Encounter  Medication Reason  . acetaminophen-codeine (TYLENOL #4) 300-60 MG tablet Patient has not taken in last 30 days  . clonazePAM (KLONOPIN) 1 MG tablet Patient has not taken in last 30 days  . doxycycline (VIBRA-TABS) 100 MG tablet Therapy completed  . fluconazole (DIFLUCAN) 150 MG tablet Therapy completed  . HYDROcodone-acetaminophen (NORCO) 10-325 MG tablet Patient has not taken in last 30 days  . levofloxacin (LEVAQUIN) 500 MG tablet Patient has not taken in last 30 days  . amLODipine (NORVASC) 5 MG tablet Reorder  . amLODipine (NORVASC) 5 MG tablet Reorder  . escitalopram (LEXAPRO) 10 MG tablet Reorder    Follow-up: No Follow-up on file.   Sherlene ShamsULLO, Rainy Rothman L, MD

## 2017-03-17 ENCOUNTER — Encounter: Payer: Self-pay | Admitting: Internal Medicine

## 2017-03-17 MED ORDER — ESCITALOPRAM OXALATE 10 MG PO TABS: 10.0000 mg | ORAL_TABLET | Freq: Every day | ORAL | 5 refills | Status: DC

## 2017-03-17 MED ORDER — AMLODIPINE BESYLATE 5 MG PO TABS: ORAL_TABLET | ORAL | 1 refills | Status: DC

## 2017-03-17 NOTE — Assessment & Plan Note (Signed)
Improved mood with increase in  cymbalta to 60 mg daily . Using alprazolam prn. The risks and benefits of benzodiazepine use were discussed with patient today including excessive sedation leading to respiratory depression,  impaired thinking/driving, and addiction.  Patient was advised to avoid concurrent use with alcohol, to use medication only as needed and not to share with others  .  

## 2017-03-17 NOTE — Assessment & Plan Note (Signed)
Well controlled on diet alone.  The previously abstracted a1c was recorded INCORRECTLY AND WAS 5.8,  NOT 8.8.  She did not tolerate  Metformin and has not taken it in over 6 months. .  Tobacco cessation advised, statin therapy advised but deferred.  Aspirin therapy advised. Diet and exercise regimen reinforced, and advised to reduce alcohol to one drink per night.   Lab Results  Component Value Date   HGBA1C 6.7 (H) 03/15/2017   Lab Results  Component Value Date   MICROALBUR 1.2 03/15/2017   Lab Results  Component Value Date   CHOL 192 03/15/2017   HDL 32.10 (L) 03/15/2017   LDLCALC 127 08/09/2015   LDLDIRECT 95.0 03/15/2017   TRIG 362.0 (H) 03/15/2017   CHOLHDL 6 03/15/2017

## 2017-03-17 NOTE — Assessment & Plan Note (Signed)
Diagnosed by sleep study. She is wearing her CPAP every night a minimum of 6 hours per night and notes improved daytime wakefulness and decreased fatigue  

## 2017-03-17 NOTE — Assessment & Plan Note (Signed)
With elevated triglcyerides and low HDL , patient prefers to continue to address with diet and exercise.   Lab Results  Component Value Date   CHOL 192 03/15/2017   HDL 32.10 (L) 03/15/2017   LDLCALC 127 08/09/2015   LDLDIRECT 95.0 03/15/2017   TRIG 362.0 (H) 03/15/2017   CHOLHDL 6 03/15/2017

## 2017-03-17 NOTE — Assessment & Plan Note (Signed)
Managed with amlodipine 5 mg daily  No changes today  Lab Results  Component Value Date   CREATININE 0.65 03/15/2017   Lab Results  Component Value Date   NA 140 03/15/2017   K 3.5 03/15/2017   CL 105 03/15/2017   CO2 28 03/15/2017   Lab Results  Component Value Date   MICROALBUR 1.2 03/15/2017

## 2017-03-17 NOTE — Assessment & Plan Note (Signed)
Symptoms are controlled with cymbalta and lexapro,  And her mood is happy. No changes today, as prior medication free periods have resulted in recurrence of depressive episodes.

## 2017-04-15 ENCOUNTER — Telehealth: Payer: Self-pay | Admitting: Internal Medicine

## 2017-04-15 DIAGNOSIS — I10 Essential (primary) hypertension: Secondary | ICD-10-CM

## 2017-04-15 NOTE — Telephone Encounter (Signed)
nto without a visit,  Not enough information given.

## 2017-04-15 NOTE — Telephone Encounter (Signed)
Patient is having fatigue for several months, was on the CPAP and states it is not helping with her fatigue, she doesn't want to come in to be seen since she was just here a month ago and stated she mention this to you about her fatigue .

## 2017-04-15 NOTE — Telephone Encounter (Signed)
Pt called about wanting to change her BP medication she's on amLODipine (NORVASC) 5 MG tablet. She's having unusual tiredness. Please advise?  Call pt @ [phone]. Thank you!

## 2017-04-15 NOTE — Telephone Encounter (Signed)
Patient was last seen 03/15/17 Next ov 08/23/17 Request change in her medication Amlodipine due to fatigue

## 2017-04-16 MED ORDER — LOSARTAN POTASSIUM 50 MG PO TABS: 50.0000 mg | ORAL_TABLET | Freq: Every day | ORAL | 0 refills | Status: DC

## 2017-04-16 NOTE — Telephone Encounter (Signed)
WILL CHANGE TO LOSARTAN, but please let her now that I said It is unusual for amlodipine to cause fatigue unless it is dropping her blood pressure too low (below 110/60) .   The change from amlodipine to losartan will require a  Non fasting blood test a week after starting the medication to make sure the new medication is not affecting her kidney function.  There are no other choices,  Because everything else will lower her heart rate or cause dehydration

## 2017-04-16 NOTE — Telephone Encounter (Signed)
Patient states that she will not use the Losartan due to she cannot afford the lab. She is going to be off the HTN because she doesn't want to be on any more blood pressure medication. She is going to stop all her medication and doesn't want the new one. She also stated she is going to monitor her BP with her cuff at home and I advise patient that she is not being compliant with your choice of treatment. I advise patient if she gets off medication and BP goes higher the 140/90 is consider dangerous for her and reaching 180/100 may lead to a stroke. Patient understood and will let you know in 2 weeks how she does off medication.

## 2017-04-25 ENCOUNTER — Telehealth: Payer: Self-pay

## 2017-04-25 ENCOUNTER — Encounter: Payer: Self-pay | Admitting: Internal Medicine

## 2017-04-25 NOTE — Telephone Encounter (Signed)
Submitted PA for Losartan on covermymeds.

## 2017-04-26 ENCOUNTER — Other Ambulatory Visit: Payer: Self-pay | Admitting: Internal Medicine

## 2017-04-26 MED ORDER — AMLODIPINE BESYLATE 10 MG PO TABS: ORAL_TABLET | ORAL | 1 refills | Status: DC

## 2017-04-30 ENCOUNTER — Ambulatory Visit
Admission: RE | Admit: 2017-04-30 | Discharge: 2017-04-30 | Disposition: A | Discharge status: Home / Self Care | Payer: No Typology Code available for payment source | Source: Ambulatory Visit | Attending: Obstetrics and Gynecology | Admitting: Obstetrics and Gynecology

## 2017-04-30 DIAGNOSIS — Z1231 Encounter for screening mammogram for malignant neoplasm of breast: Secondary | ICD-10-CM | POA: Diagnosis not present

## 2017-04-30 DIAGNOSIS — Z780 Asymptomatic menopausal state: Secondary | ICD-10-CM | POA: Insufficient documentation

## 2017-04-30 DIAGNOSIS — M85851 Other specified disorders of bone density and structure, right thigh: Secondary | ICD-10-CM | POA: Insufficient documentation

## 2017-04-30 DIAGNOSIS — Z8262 Family history of osteoporosis: Secondary | ICD-10-CM | POA: Insufficient documentation

## 2017-04-30 DIAGNOSIS — Z13820 Encounter for screening for osteoporosis: Secondary | ICD-10-CM

## 2017-04-30 DIAGNOSIS — Z720 Tobacco use: Secondary | ICD-10-CM | POA: Insufficient documentation

## 2017-05-07 ENCOUNTER — Other Ambulatory Visit: Payer: Self-pay | Admitting: Internal Medicine

## 2017-07-24 NOTE — Telephone Encounter (Signed)
Error

## 2017-08-22 ENCOUNTER — Ambulatory Visit: Payer: PRIVATE HEALTH INSURANCE | Admitting: Internal Medicine

## 2017-08-23 ENCOUNTER — Ambulatory Visit: Payer: PRIVATE HEALTH INSURANCE | Admitting: Internal Medicine

## 2017-09-02 ENCOUNTER — Other Ambulatory Visit: Payer: Self-pay | Admitting: Internal Medicine

## 2017-09-02 NOTE — Telephone Encounter (Signed)
Patient was seen 02/2017 Has upcoming OV 09/16/2017   Copied from CRM #[id number]. Topic: Quick Communication - See Telephone Encounter >> Sep 02, 2017  3:38 PM Landry MellowFoltz, Melissa J wrote: CRM for notification. See Telephone encounter for:   09/02/17.pt called - drug store is waiting for Provider ok for DULoxetine (CYMBALTA) 60 MG capsule refill.  Pt wants you to know that she has upcoming appt on 09-16-17.  Pt is requesting that this refill be ok'd

## 2017-09-02 NOTE — Telephone Encounter (Signed)
Refilled and sent   

## 2017-09-09 ENCOUNTER — Other Ambulatory Visit: Payer: Self-pay | Admitting: Internal Medicine

## 2017-09-09 NOTE — Telephone Encounter (Signed)
Last script 01/18/17 #30 with 4 rf Last o/v 03/15/17 F/u 09/16/17

## 2017-09-10 NOTE — Telephone Encounter (Signed)
Printed, signed and faxed.  

## 2017-09-16 ENCOUNTER — Encounter: Payer: Self-pay | Admitting: Internal Medicine

## 2017-09-16 ENCOUNTER — Ambulatory Visit (INDEPENDENT_AMBULATORY_CARE_PROVIDER_SITE_OTHER): Payer: No Typology Code available for payment source | Admitting: Internal Medicine

## 2017-09-16 VITALS — BP 104/72 | HR 95 | Temp 98.5°F | Resp 15 | Ht 62.0 in | Wt 133.2 lb

## 2017-09-16 DIAGNOSIS — M85859 Other specified disorders of bone density and structure, unspecified thigh: Secondary | ICD-10-CM | POA: Diagnosis not present

## 2017-09-16 DIAGNOSIS — F331 Major depressive disorder, recurrent, moderate: Secondary | ICD-10-CM

## 2017-09-16 DIAGNOSIS — Z23 Encounter for immunization: Secondary | ICD-10-CM | POA: Diagnosis not present

## 2017-09-16 DIAGNOSIS — G8929 Other chronic pain: Secondary | ICD-10-CM

## 2017-09-16 DIAGNOSIS — E119 Type 2 diabetes mellitus without complications: Secondary | ICD-10-CM | POA: Diagnosis not present

## 2017-09-16 DIAGNOSIS — M546 Pain in thoracic spine: Secondary | ICD-10-CM

## 2017-09-16 MED ORDER — ESCITALOPRAM OXALATE 10 MG PO TABS: 10.0000 mg | ORAL_TABLET | Freq: Every day | ORAL | 5 refills | Status: DC

## 2017-09-16 MED ORDER — AMLODIPINE BESYLATE 10 MG PO TABS: ORAL_TABLET | ORAL | 1 refills | Status: DC

## 2017-09-16 MED ORDER — TIZANIDINE HCL 4 MG PO CAPS: 4.0000 mg | ORAL_CAPSULE | Freq: Three times a day (TID) | ORAL | 2 refills | Status: DC

## 2017-09-16 MED ORDER — DULOXETINE HCL 60 MG PO CPEP: ORAL_CAPSULE | ORAL | 5 refills | Status: DC

## 2017-09-16 MED ORDER — ALPRAZOLAM 1 MG PO TABS: ORAL_TABLET | ORAL | 5 refills | Status: DC

## 2017-09-16 NOTE — Progress Notes (Signed)
Subjective:  Patient ID: Ann Baxter, female    DOB: 03-16-1961  Age: 56 y.o. MRN: [id number]  CC: The primary encounter diagnosis was Need for 23-polyvalent pneumococcal polysaccharide vaccine. Diagnoses of Osteopenia of hip, unspecified laterality, Diet-controlled diabetes mellitus (HCC), Chronic midline thoracic back pain, and Major depressive disorder, recurrent episode, moderate (HCC) were also pertinent to this visit.  HPI Ann Baxter presents for follow up on hypertension, diet controlled diabetes, and And OSA     She has had recent labs done at her husband's lab site.   Fasting glu 148 and most recent a1c was 6.7 Has changed her diet  for the past week. Motivated by a desire to avoid medication for diabetes.  Energy level is better  Reduced carbs  Has lost 5 lbs butstill has increased abdominal girth    Tearful today on numerous occasions.  Husband's company is closing and she will be uninsured as of Dec 31st. .    Orthopedic  issues affecting neck, right hand  and mid back causing her to become unable to continue watching her grandchildren and she is distraught about this. seeing Chiropractor Alfredo Bach prn   Still smoking 1/2 to pack daily .  Husband quit smoking with Chantix     Lab Results  Component Value Date   HGBA1C 6.7 (H) 03/15/2017   Lab Results  Component Value Date   MICROALBUR 1.2 03/15/2017     Outpatient Medications Prior to Visit  Medication Sig Dispense Refill  . levocetirizine (XYZAL) 5 MG tablet Take 5 mg by mouth every evening.    . Multiple Vitamins-Minerals (WOMENS MULTIVITAMIN PLUS PO) Take by mouth.    . ALPRAZolam (XANAX) 1 MG tablet TAKE 1 TABLET BY MOUTH TWICE A DAY AS NEEDED FOR ANXIETY OR SLEEP 30 tablet 0  . amLODipine (NORVASC) 10 MG tablet TAKE ONE (1) TABLET EACH DAY 90 tablet 1  . cyclobenzaprine (FLEXERIL) 10 MG tablet Take 1 tablet (10 mg total) by mouth 3 (three) times daily as needed for muscle spasms. 90 tablet 5  . DULoxetine  (CYMBALTA) 60 MG capsule TAKE 1 CAPSULE BY MOUTH EVERY DAY. 30 capsule 1  . escitalopram (LEXAPRO) 10 MG tablet Take 1 tablet (10 mg total) by mouth daily. 30 tablet 5  . losartan (COZAAR) 50 MG tablet Take 1 tablet (50 mg total) by mouth daily. 30 tablet 0  . Probiotic Product (ALIGN) 4 MG CAPS Take by mouth daily.     No facility-administered medications prior to visit.     Review of Systems;  Patient denies headache, fevers, malaise, unintentional weight loss, skin rash, eye pain, sinus congestion and sinus pain, sore throat, dysphagia,  hemoptysis , cough, dyspnea, wheezing, chest pain, palpitations, orthopnea, edema, abdominal pain, nausea, melena, diarrhea, constipation, flank pain, dysuria, hematuria, urinary  Frequency, nocturia, numbness, tingling, seizures,  Focal weakness, Loss of consciousness,  Tremor, insomnia, depression, anxiety, and suicidal ideation.      Objective:  BP 104/72 (BP Location: Left Arm, Patient Position: Sitting, Cuff Size: Normal)   Pulse 95   Temp 98.5 F (36.9 C) (Oral)   Resp 15   Ht 5\' 2"  (1.575 m)   Wt 133 lb 3.2 oz (60.4 kg)   SpO2 97%   BMI 24.36 kg/m   BP Readings from Last 3 Encounters:  09/16/17 104/72  03/15/17 120/86  10/16/16 130/78    Wt Readings from Last 3 Encounters:  09/16/17 133 lb 3.2 oz (60.4 kg)  03/15/17 138 lb 3.2  oz (62.7 kg)  10/16/16 138 lb 12 oz (62.9 kg)    General appearance: alert, cooperative and appears stated age Ears: normal TM's and external ear canals both ears Throat: lips, mucosa, and tongue normal; teeth and gums normal Neck: no adenopathy, no carotid bruit, supple, symmetrical, trachea midline and thyroid not enlarged, symmetric, no tenderness/mass/nodules Back: symmetric, no curvature. ROM normal. No CVA tenderness. Lungs: clear to auscultation bilaterally Heart: regular rate and rhythm, S1, S2 normal, no murmur, click, rub or gallop Abdomen: soft, non-tender; bowel sounds normal; no masses,  no  organomegaly Pulses: 2+ and symmetric Skin: Skin color, texture, turgor normal. No rashes or lesions Lymph nodes: Cervical, supraclavicular, and axillary nodes normal.  Lab Results  Component Value Date   HGBA1C 6.7 (H) 03/15/2017   HGBA1C 6.3 02/14/2016   HGBA1C 8.8 (A) 08/09/2015    Lab Results  Component Value Date   CREATININE 0.65 03/15/2017   CREATININE 0.65 02/14/2016   CREATININE 0.8 08/09/2015    Lab Results  Component Value Date   WBC 10.1 03/15/2017   HGB 14.9 03/15/2017   HCT 43.5 03/15/2017   PLT 238.0 03/15/2017   GLUCOSE 113 (H) 03/15/2017   CHOL 192 03/15/2017   TRIG 362.0 (H) 03/15/2017   HDL 32.10 (L) 03/15/2017   LDLDIRECT 95.0 03/15/2017   LDLCALC 127 08/09/2015   ALT 33 03/15/2017   AST 24 03/15/2017   NA 140 03/15/2017   K 3.5 03/15/2017   CL 105 03/15/2017   CREATININE 0.65 03/15/2017   BUN 10 03/15/2017   CO2 28 03/15/2017   TSH 0.84 03/15/2017   HGBA1C 6.7 (H) 03/15/2017   MICROALBUR 1.2 03/15/2017    Dg Bone Density (dxa)  Result Date: 04/30/2017 EXAM: DUAL X-RAY ABSORPTIOMETRY (DXA) FOR BONE MINERAL DENSITY IMPRESSION: Dear Dr Cassell Clement, Your patient Ann Baxter completed a BMD test on 04/30/2017 using the [employer] Prodigy Advance DXA System (analysis version: 14.10) manufactured by [employer]. The following summarizes the results of our evaluation. PATIENT BIOGRAPHICAL: Name: Ann, Baxter Patient ID: [id number] Birth Date: Apr 12, 1961 Height: 62.0 in. Gender: Female Exam Date: 04/30/2017 Weight: 138.6 lbs. Indications: Caucasian, Early Menopause, Family History of Fracture, Family Hx of Osteoporosis, Low Body Weight, POSTmenopausal, Tobacco User, Tobacco User (Current Smoker), Family Hist. (Parent hip fracture) Fractures: Treatments: multivitamin ASSESSMENT: The BMD measured at Femur Neck Right is 0.884 g/cm2 with a T-score of -1.1. This patient is considered osteopenic according to World Health Organization [employer]) criteria. Site  Region Measured Measured WHO Young Adult BMD Date       Age      Classification T-score DualFemur Neck Right 04/30/2017 57.5 Osteopenia -1.1 0.884 g/cm2 AP Spine L1-L4 04/30/2017 57.5 Normal -0.2 1.157 g/cm2 World Health Organization [facility]) criteria for post-menopausal, Caucasian Women: Normal:       T-score at or above -1 SD Osteopenia:   T-score between -1 and -2.5 SD Osteoporosis: T-score at or below -2.5 SD RECOMMENDATIONS: National Osteoporosis Foundation recommends that FDA-approved medical therapies be considered in postemenopausal women and men age 80 or older with a: 1. Hip or vertebral (clinical or morphometric) fracture. 2. T-score of < -2.5at the spine or hip. 3. Ten-year fracture probability by FRAX of 3% or greater for hip fracture or 20% or greater for major osteoporotic fracture. All treatment decisions require clinical judgment and consideration of individual patient factors, including patient preferences, co-morbidities, previous drug use, risk factors not captured in the FRAX model (Baxter.g. falls, vitamin D deficiency, increased bone turnover, interval  significant decline in bone density) and possible under - or over-estimation of fracture risk by FRAX. All patients should ensure an adequate intake of dietary calcium (1200 mg/d) and vitamin D (800 IU daily) unless contraindicated. FOLLOW-UP: People with diagnosed cases of osteoporosis or at high risk for fracture should have regular bone mineral density tests. For patients eligible for Medicare, routine testing is allowed once every 2 years. The testing frequency can be increased to one year for patients who have rapidly progressing disease, those who are receiving or discontinuing medical therapy to restore bone mass, or have additional risk factors. I have reviewed this report, and agree with the above findings. [facility] Dear Dr Cassell ClementLynde Knowles-Jonas, Your patient Ann Baxter completed a FRAX assessment on 04/30/2017 using the [facility]  Prodigy Advance DXA System (analysis version: 14.10) manufactured by [employer]. The following summarizes the results of our evaluation. PATIENT BIOGRAPHICAL: Name: Ann Baxter, Ann Baxter Patient ID: [id number] Birth Date: 1961/04/17 Height:    62.0 in. Gender:     Female    [age]       Weight:    138.6 lbs. Ethnicity:  White                            Exam Date: 04/30/2017 FRAX* RESULTS:  (version: 3.5) 10-year Probability of Fracture1 Major Osteoporotic Fracture2 Hip Fracture 12.9% 0.7% Population: [place] (Caucasian) Risk Factors: Tobacco User (Current Smoker), Family Hist. (Parent hip fracture) Based on Femur (Right) Neck BMD 1 -The 10-year probability of fracture may be lower than reported if the patient has received treatment. 2 -Major Osteoporotic Fracture: Clinical Spine, Forearm, Hip or Shoulder *FRAX is a Armed forces logistics/support/administrative officertradem[employer] of the [employer] for Metabolic Bone Disease, a World Science writerHealth Organization (WHO) [employer]. ASSESSMENT: The probability of a major osteoporotic fracture is 12.9% within the next ten years. The probability of a hip fracture is 0.7% within the next ten years. Electronically Signed   By: Bretta BangWilliam  Woodruff III M.D.   On: 04/30/2017 09:29   Mm Digital Screening Bilateral  Result Date: 04/30/2017 CLINICAL DATA:  Screening. EXAM: DIGITAL SCREENING BILATERAL MAMMOGRAM WITH CAD COMPARISON:  Previous exam(s). ACR Breast Density Category b: There are scattered areas of fibroglandular density. FINDINGS: There are no findings suspicious for malignancy. Images were processed with CAD. IMPRESSION: No mammographic evidence of malignancy. A result letter of this screening mammogram will be mailed directly to the patient. RECOMMENDATION: Screening mammogram in one year. (Code:SM-B-01Y) BI-RADS CATEGORY  1: Negative. Electronically Signed   By: Edwin CapJennifer  Jarosz M.D.   On: 04/30/2017 10:00    Assessment & Plan:   Problem List Items Addressed This Visit     Chronic midline thoracic back pain    Aggravated by the strain of lifting her grandchildren.   Continue ibuprofen 600 mg every 8 hours,  Adding tizanidine ,.  Advised to add daily PPI to avoid gastritis.       Relevant Medications   tiZANidine (ZANAFLEX) 4 MG capsule   DULoxetine (CYMBALTA) 60 MG capsule   escitalopram (LEXAPRO) 10 MG tablet   Diet-controlled diabetes mellitus (HCC)    Slight loss of control on diet alone.  The previously abstracted a1c was recorded INCORRECTLY AND WAS 5.8,  NOT 8.8.  She did not tolerate  Metformin and has not taken it in over 6 months. .  Tobacco cessation advised, statin therapy advised but deferred.  Aspirin therapy advised. Diet and exercise regimen reinforced, and advised to reduce alcohol to one drink per night.   Lab Results  Component Value Date   HGBA1C 6.7 (H) 03/15/2017   Lab Results  Component Value Date   MICROALBUR 1.2 03/15/2017   Lab Results  Component Value Date   CHOL 192 03/15/2017   HDL 32.10 (L) 03/15/2017   LDLCALC 127 08/09/2015   LDLDIRECT 95.0 03/15/2017   TRIG 362.0 (H) 03/15/2017   CHOLHDL 6 03/15/2017         Major depressive disorder, recurrent episode, moderate (HCC)    Continue cymbalta, lexapro and prn alprazolam.       Relevant Medications   ALPRAZolam (XANAX) 1 MG tablet   DULoxetine (CYMBALTA) 60 MG capsule   escitalopram (LEXAPRO) 10 MG tablet   Osteopenia    By DEXA July 2018 .  Tobacco cessation, weight bearing exercise,  calcium supplementation advised       Other Visit Diagnoses    Need for 23-polyvalent pneumococcal polysaccharide vaccine    -  Primary   Relevant Orders   Pneumococcal polysaccharide vaccine 23-valent greater than or equal to 2yo subcutaneous/IM (Completed)    A total of 25 minutes of face to face time was spent with patient more than half of which was spent in counselling on the above mentioned issues.   I have discontinued Mayrani Baxter. Shanholtzer's cyclobenzaprine, ALIGN, and  losartan. I have also changed her escitalopram. Additionally, I am having her start on tiZANidine. Lastly, I am having her maintain her Multiple Vitamins-Minerals (WOMENS MULTIVITAMIN PLUS PO), levocetirizine, ALPRAZolam, amLODipine, and DULoxetine.  Meds ordered this encounter  Medications  . tiZANidine (ZANAFLEX) 4 MG capsule    Sig: Take 1 capsule (4 mg total) 3 (three) times daily by mouth.    Dispense:  90 capsule    Refill:  2  . ALPRAZolam (XANAX) 1 MG tablet    Sig: TAKE 1 TABLET BY MOUTH TWICE A DAY AS NEEDED FOR ANXIETY OR SLEEP    Dispense:  60 tablet    Refill:  5  . amLODipine (NORVASC) 10 MG tablet    Sig: TAKE ONE (1) TABLET EACH DAY    Dispense:  90 tablet    Refill:  1    KEEP ON FILE FOR FUTURE REFILLS.   NOTE DOSE CHANGE  . DULoxetine (CYMBALTA) 60 MG capsule    Sig: TAKE 1 CAPSULE BY MOUTH EVERY DAY.    Dispense:  30 capsule    Refill:  5  . escitalopram (LEXAPRO) 10 MG tablet    Sig: Take 1 tablet (10 mg total) daily by mouth.    Dispense:  30 tablet    Refill:  5    Generic please    Medications Discontinued During This Encounter  Medication Reason  . Probiotic Product (ALIGN) 4 MG CAPS Patient has not taken in last 30 days  . losartan (COZAAR) 50 MG tablet   . ALPRAZolam (XANAX) 1 MG tablet Reorder  . cyclobenzaprine (FLEXERIL) 10 MG tablet   . amLODipine (NORVASC) 10 MG tablet Reorder  . DULoxetine (CYMBALTA) 60 MG capsule Reorder  . escitalopram (LEXAPRO) 10 MG tablet Reorder    Follow-up: No Follow-up on file.   Sherlene Shamseresa L Attie Nawabi, MD

## 2017-09-16 NOTE — Patient Instructions (Addendum)
I have changed your muscle relaxer to tizanidine .  It is stronger  You can use OTC ibuprofen 600 to 800 mg  Every 8 hours for back pain and muscle spasm AND you should take an OTC prilosec to protect you from getting gastritis   Your diet is excellent .    [employer] g[employer] ,   [place] triple Zero   You received the pneumonia vaccine today    Diabetes Mellitus and Standards of Medical Care Managing diabetes (diabetes mellitus) can be complicated. Your diabetes treatment may be managed by a team of health care providers, including:  A diet and nutrition specialist (registered dietitian).  A nurse.  A certified diabetes educator (CDE).  A diabetes specialist (endocrinologist).  An eye doctor.  A primary care provider.  A dentist.  Your health care providers follow a schedule in order to help you get the best quality of care. The following schedule is a general guideline for your diabetes management plan. Your health care providers may also give you more specific instructions. HbA1c ( hemoglobin A1c) test This test provides information about blood sugar (glucose) control over the previous 2-3 months. It is used to check whether your diabetes management plan needs to be adjusted.  If you are meeting your treatment goals, this test is done at least 2 times a year.  If you are not meeting treatment goals or if your treatment goals have changed, this test is done 4 times a year.  Blood pressure test  This test is done at every routine medical visit. For most people, the goal is less than 130/80. Ask your health care provider what your goal blood pressure should be. Dental and eye exams  Visit your dentist two times a year.  If you have type 1 diabetes, get an eye exam 3-5 years after you are diagnosed, and then once a year after your first exam. ? If you were diagnosed with type 1 diabetes as a child, get an eye exam when you are age 31 or older and have had diabetes for 3-5  years. After the first exam, you should get an eye exam once a year.  If you have type 2 diabetes, have an eye exam as soon as you are diagnosed, and then once a year after your first exam. Foot care exam  Visual foot exams are done at every routine medical visit. The exams check for cuts, bruises, redness, blisters, sores, or other problems with the feet.  A complete foot exam is done by your health care provider once a year. This exam includes an inspection of the structure and skin of your feet, and a check of the pulses and sensation in your feet. ? Type 1 diabetes: Get your first exam 3-5 years after diagnosis. ? Type 2 diabetes: Get your first exam as soon as you are diagnosed.  Check your feet every day for cuts, bruises, redness, blisters, or sores. If you have any of these or other problems that are not healing, contact your health care provider. Kidney function test ( urine microalbumin)  This test is done once a year. ? Type 1 diabetes: Get your first test 5 years after diagnosis. ? Type 2 diabetes: Get your first test as soon as you are diagnosed.  If you have chronic kidney disease (CKD), get a serum creatinine and estimated glomerular filtration rate (eGFR) test once a year. Lipid profile (cholesterol, HDL, LDL, triglycerides)  This test should be done when you  are diagnosed with diabetes, and every 5 years after the first test. If you are on medicines to lower your cholesterol, you may need to get this test done every year. ? The goal for LDL is less than 100 mg/dL (5.5 mmol/L). If you are at high risk, the goal is less than 70 mg/dL (3.9 mmol/L). ? The goal for HDL is 40 mg/dL (2.2 mmol/L) for men and 50 mg/dL(2.8 mmol/L) for women. An HDL cholesterol of 60 mg/dL (3.3 mmol/L) or higher gives some protection against heart disease. ? The goal for triglycerides is less than 150 mg/dL (8.3 mmol/L). Immunizations  The yearly flu (influenza) vaccine is recommended for everyone 6  months or older who has diabetes.  The pneumonia (pneumococcal) vaccine is recommended for everyone 2 years or older who has diabetes. If you are 31 or older, you may get the pneumonia vaccine as a series of two separate shots.  The hepatitis B vaccine is recommended for adults shortly after they have been diagnosed with diabetes.  The Tdap (tetanus, diphtheria, and pertussis) vaccine should be given: ? According to normal childhood vaccination schedules, for children. ? Every 10 years, for adults who have diabetes.  The shingles vaccine is recommended for people who have had chicken pox and are 50 years or older. Mental and emotional health  Screening for symptoms of eating disorders, anxiety, and depression is recommended at the time of diagnosis and afterward as needed. If your screening shows that you have symptoms (you have a positive screening result), you may need further evaluation and be referred to a mental health care provider. Diabetes self-management education  Education about how to manage your diabetes is recommended at diagnosis and ongoing as needed. Treatment plan  Your treatment plan will be reviewed at every medical visit. Summary  Managing diabetes (diabetes mellitus) can be complicated. Your diabetes treatment may be managed by a team of health care providers.  Your health care providers follow a schedule in order to help you get the best quality of care.  Standards of care including having regular physical exams, blood tests, blood pressure monitoring, immunizations, screening tests, and education about how to manage your diabetes.  Your health care providers may also give you more specific instructions based on your individual health. This information is not intended to replace advice given to you by your health care provider. Make sure you discuss any questions you have with your health care provider. Document Released: 08/12/2009 Document Revised: 07/13/2016  Document Reviewed: 07/13/2016 Elsevier Interactive Patient Education  [employer].

## 2017-09-17 DIAGNOSIS — M8580 Other specified disorders of bone density and structure, unspecified site: Secondary | ICD-10-CM | POA: Insufficient documentation

## 2017-09-17 DIAGNOSIS — M546 Pain in thoracic spine: Secondary | ICD-10-CM

## 2017-09-17 DIAGNOSIS — G8929 Other chronic pain: Secondary | ICD-10-CM | POA: Insufficient documentation

## 2017-09-17 NOTE — Assessment & Plan Note (Signed)
By DEXA July 2018 .  Tobacco cessation, weight bearing exercise,  calcium supplementation advised

## 2017-09-17 NOTE — Assessment & Plan Note (Signed)
Slight loss of control on diet alone.  The previously abstracted a1c was recorded INCORRECTLY AND WAS 5.8,  NOT 8.8.  She did not tolerate  Metformin and has not taken it in over 6 months. .  Tobacco cessation advised, statin therapy advised but deferred.  Aspirin therapy advised. Diet and exercise regimen reinforced, and advised to reduce alcohol to one drink per night.   Lab Results  Component Value Date   HGBA1C 6.7 (H) 03/15/2017   Lab Results  Component Value Date   MICROALBUR 1.2 03/15/2017   Lab Results  Component Value Date   CHOL 192 03/15/2017   HDL 32.10 (L) 03/15/2017   LDLCALC 127 08/09/2015   LDLDIRECT 95.0 03/15/2017   TRIG 362.0 (H) 03/15/2017   CHOLHDL 6 03/15/2017

## 2017-09-17 NOTE — Assessment & Plan Note (Addendum)
Continue cymbalta, lexapro and prn alprazolam.

## 2017-09-17 NOTE — Assessment & Plan Note (Addendum)
Aggravated by the strain of lifting her grandchildren.   Continue ibuprofen 600 mg every 8 hours,  Adding tizanidine ,.  Advised to add daily PPI to avoid gastritis.

## 2018-05-05 ENCOUNTER — Telehealth: Payer: Self-pay | Admitting: Internal Medicine

## 2018-05-05 ENCOUNTER — Other Ambulatory Visit: Payer: Self-pay

## 2018-05-05 MED ORDER — ESCITALOPRAM OXALATE 10 MG PO TABS: 10.0000 mg | ORAL_TABLET | Freq: Every day | ORAL | 0 refills | Status: DC

## 2018-05-05 MED ORDER — DULOXETINE HCL 60 MG PO CPEP: ORAL_CAPSULE | ORAL | 0 refills | Status: DC

## 2018-05-05 MED ORDER — AMLODIPINE BESYLATE 10 MG PO TABS: ORAL_TABLET | ORAL | 0 refills | Status: DC

## 2018-05-05 NOTE — Telephone Encounter (Signed)
Copied from CRM [phone]. Topic: Quick Communication - See Telephone Encounter >> May 05, 2018 10:05 AM Tamela OddiMartin, [place], NT wrote: CRM for notification. See Telephone encounter for: 05/05/18. Patient states she needs a refill of DULoxetine (CYMBALTA) 60 MG capsule,escitalopram (LEXAPRO), 10 MG tablet ,  and  amLODipine (NORVASC) 10 MG tablet  Patient states she gets these RX generic. She had an appt for a med refill on 06/24/18. Can Dr. Kathlynn Grateiullo send in enough medication until this appt.   [facility] [address] [place] - [address] [phone] (Phone) [phone] (Fax)

## 2018-05-07 ENCOUNTER — Other Ambulatory Visit: Payer: Self-pay | Admitting: Internal Medicine

## 2018-05-09 NOTE — Telephone Encounter (Signed)
Last OV 09/16/17 last filled 09/16/17 60 5rf Next OV 06/24/18

## 2018-06-24 ENCOUNTER — Encounter: Payer: Self-pay | Admitting: Internal Medicine

## 2018-06-24 ENCOUNTER — Ambulatory Visit (INDEPENDENT_AMBULATORY_CARE_PROVIDER_SITE_OTHER): Payer: Self-pay | Admitting: Internal Medicine

## 2018-06-24 VITALS — BP 126/82 | HR 83 | Temp 98.9°F | Resp 14 | Ht 62.0 in | Wt 126.0 lb

## 2018-06-24 DIAGNOSIS — Z716 Tobacco abuse counseling: Secondary | ICD-10-CM

## 2018-06-24 DIAGNOSIS — E785 Hyperlipidemia, unspecified: Secondary | ICD-10-CM

## 2018-06-24 DIAGNOSIS — F4323 Adjustment disorder with mixed anxiety and depressed mood: Secondary | ICD-10-CM

## 2018-06-24 DIAGNOSIS — E119 Type 2 diabetes mellitus without complications: Secondary | ICD-10-CM

## 2018-06-24 DIAGNOSIS — I10 Essential (primary) hypertension: Secondary | ICD-10-CM

## 2018-06-24 DIAGNOSIS — N951 Menopausal and female climacteric states: Secondary | ICD-10-CM

## 2018-06-24 MED ORDER — ALPRAZOLAM 1 MG PO TABS: ORAL_TABLET | ORAL | 5 refills | Status: DC

## 2018-06-24 MED ORDER — DULOXETINE HCL 60 MG PO CPEP: ORAL_CAPSULE | ORAL | 1 refills | Status: DC

## 2018-06-24 MED ORDER — TIZANIDINE HCL 4 MG PO CAPS: 4.0000 mg | ORAL_CAPSULE | Freq: Three times a day (TID) | ORAL | 2 refills | Status: AC

## 2018-06-24 MED ORDER — AMLODIPINE BESYLATE 10 MG PO TABS: ORAL_TABLET | ORAL | 1 refills | Status: DC

## 2018-06-24 NOTE — Progress Notes (Signed)
Subjective:  Patient ID: Ann Baxter, female    DOB: 1960-11-13  Age: 57 y.o. MRN: [id number]  CC: The primary encounter diagnosis was Diet-controlled diabetes mellitus (HCC). Diagnoses of Essential hypertension, Hyperlipidemia LDL goal <130, Tobacco abuse counseling, Symptomatic menopausal or female climacteric states, and Adjustment disorder with mixed anxiety and depressed mood were also pertinent to this visit.  HPI CHARLSEY MORAGNE presents for MEDICATION REFILL   And follow up on T2DM and hypertension .  Has been lost to follow up,  Last seen Nov 2018  Due to loss of insurance,  Uninsured currently    Following  a low GI diet  Wt loss acknowledged.  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  2 toddlers  . Physically very active with the 57 yr old.   Using sudafed 10 mg once daily on a prn basis for chronic  sinus headaches and ear pain .  Balances it with 1/2 alprazolam .  Once daily    Outpatient Medications Prior to Visit  Medication Sig Dispense Refill  . escitalopram (LEXAPRO) 10 MG tablet Take 1 tablet (10 mg total) by mouth daily. 60 tablet 0  . levocetirizine (XYZAL) 5 MG tablet Take 5 mg by mouth every evening.    Marland Kitchen ALPRAZolam (XANAX) 1 MG tablet TAKE 1 TABLET BY MOUTH TWICE A DAY AS NEEDED FOR ANXIETY OR SLEEP 60 tablet 0  . amLODipine (NORVASC) 10 MG tablet TAKE ONE (1) TABLET EACH DAY 60 tablet 0  . DULoxetine (CYMBALTA) 60 MG capsule TAKE 1 CAPSULE BY MOUTH EVERY DAY. 60 capsule 0  . tiZANidine (ZANAFLEX) 4 MG capsule Take 1 capsule (4 mg total) 3 (three) times daily by mouth. 90 capsule 2  . Multiple Vitamins-Minerals (WOMENS MULTIVITAMIN PLUS PO) Take by mouth.     No facility-administered medications prior to visit.     Review of Systems;  Patient denies headache, fevers, malaise, unintentional weight loss, skin rash, eye pain, sinus congestion and sinus pain, sore throat, dysphagia,  hemoptysis , cough, dyspnea, wheezing, chest pain, palpitations, orthopnea,  edema, abdominal pain, nausea, melena, diarrhea, constipation, flank pain, dysuria, hematuria, urinary  Frequency, nocturia, numbness, tingling, seizures,  Focal weakness, Loss of consciousness,  Tremor, insomnia, depression, anxiety, and suicidal ideation.      Objective:  BP 126/82 (BP Location: Left Arm, Patient Position: Sitting, Cuff Size: Normal)   Pulse 83   Temp 98.9 F (37.2 C) (Oral)   Resp 14   Ht 5\' 2"  (1.575 m)   Wt 126 lb (57.2 kg)   SpO2 98%   BMI 23.05 kg/m   BP Readings from Last 3 Encounters:  06/24/18 126/82  09/16/17 104/72  03/15/17 120/86    Wt Readings from Last 3 Encounters:  06/24/18 126 lb (57.2 kg)  09/16/17 133 lb 3.2 oz (60.4 kg)  03/15/17 138 lb 3.2 oz (62.7 kg)    General appearance: alert, cooperative and appears stated age Ears: normal TM's and external ear canals both ears Throat: lips, mucosa, and tongue normal; teeth and gums normal Neck: no adenopathy, no carotid bruit, supple, symmetrical, trachea midline and thyroid not enlarged, symmetric, no tenderness/mass/nodules Back: symmetric, no curvature. ROM normal. No CVA tenderness. Lungs: clear to auscultation bilaterally Heart: regular rate and rhythm, S1, S2 normal, no murmur, click, rub or gallop Abdomen: soft, non-tender; bowel sounds normal; no masses,  no organomegaly Pulses: 2+ and symmetric Skin: Skin color, texture, turgor normal. No rashes or lesions Lymph nodes: Cervical, supraclavicular, and axillary nodes  normal.  Lab Results  Component Value Date   HGBA1C 6.7 (H) 03/15/2017   HGBA1C 6.3 02/14/2016   HGBA1C 8.8 (A) 08/09/2015    Lab Results  Component Value Date   CREATININE 0.65 03/15/2017   CREATININE 0.65 02/14/2016   CREATININE 0.8 08/09/2015    Lab Results  Component Value Date   WBC 10.1 03/15/2017   HGB 14.9 03/15/2017   HCT 43.5 03/15/2017   PLT 238.0 03/15/2017   GLUCOSE 113 (H) 03/15/2017   CHOL 192 03/15/2017   TRIG 362.0 (H) 03/15/2017   HDL  32.10 (L) 03/15/2017   LDLDIRECT 95.0 03/15/2017   LDLCALC 127 08/09/2015   ALT 33 03/15/2017   AST 24 03/15/2017   NA 140 03/15/2017   K 3.5 03/15/2017   CL 105 03/15/2017   CREATININE 0.65 03/15/2017   BUN 10 03/15/2017   CO2 28 03/15/2017   TSH 0.84 03/15/2017   HGBA1C 6.7 (H) 03/15/2017   MICROALBUR 1.2 03/15/2017    Dg Bone Density (dxa)  Result Date: 04/30/2017 EXAM: DUAL X-RAY ABSORPTIOMETRY (DXA) FOR BONE MINERAL DENSITY IMPRESSION: Dear Dr Cassell Clement, Your patient Deeya Richeson completed a BMD test on 04/30/2017 using the Lunar Prodigy Advance DXA System (analysis version: 14.10) manufactured by [employer]. The following summarizes the results of our evaluation. PATIENT BIOGRAPHICAL: Name: Kaileena, Obi Patient ID: [id number] Birth Date: 1961/07/13 Height: 62.0 in. Gender: Female Exam Date: 04/30/2017 Weight: 138.6 lbs. Indications: Caucasian, Early Menopause, Family History of Fracture, Family Hx of Osteoporosis, Low Body Weight, POSTmenopausal, Tobacco User, Tobacco User (Current Smoker), Family Hist. (Parent hip fracture) Fractures: Treatments: multivitamin ASSESSMENT: The BMD measured at Femur Neck Right is 0.884 g/cm2 with a T-score of -1.1. This patient is considered osteopenic according to World Health Organization [facility]) criteria. Site Region Measured Measured WHO Young Adult BMD Date       Age      Classification T-score DualFemur Neck Right 04/30/2017 57.5 Osteopenia -1.1 0.884 g/cm2 AP Spine L1-L4 04/30/2017 57.5 Normal -0.2 1.157 g/cm2 World Health Organization [facility]) criteria for post-menopausal, Caucasian Women: Normal:       T-score at or above -1 SD Osteopenia:   T-score between -1 and -2.5 SD Osteoporosis: T-score at or below -2.5 SD RECOMMENDATIONS: National Osteoporosis Foundation recommends that FDA-approved medical therapies be considered in postemenopausal women and men age 49 or older with a: 1. Hip or vertebral (clinical or morphometric) fracture. 2. T-score  of < -2.5at the spine or hip. 3. Ten-year fracture probability by FRAX of 3% or greater for hip fracture or 20% or greater for major osteoporotic fracture. All treatment decisions require clinical judgment and consideration of individual patient factors, including patient preferences, co-morbidities, previous drug use, risk factors not captured in the FRAX model (e.g. falls, vitamin D deficiency, increased bone turnover, interval significant decline in bone density) and possible under - or over-estimation of fracture risk by FRAX. All patients should ensure an adequate intake of dietary calcium (1200 mg/d) and vitamin D (800 IU daily) unless contraindicated. FOLLOW-UP: People with diagnosed cases of osteoporosis or at high risk for fracture should have regular bone mineral density tests. For patients eligible for Medicare, routine testing is allowed once every 2 years. The testing frequency can be increased to one year for patients who have rapidly progressing disease, those who are receiving or discontinuing medical therapy to restore bone mass, or have additional risk factors. I have reviewed this report, and agree with the above findings. [facility] Dear Dr Waverly Ferrari  Knowles-Jonas, Your patient Ann DockerLavon E Unangst completed a FRAX assessment on 04/30/2017 using the [employer] Advance DXA System (analysis version: 14.10) manufactured by [employer]. The following summarizes the results of our evaluation. PATIENT BIOGRAPHICAL: Name: Ann DockerWhitt, Alianys E Patient ID: [id number] Birth Date: 28-Aug-1961 Height:    62.0 in. Gender:     Female    [age]       Weight:    138.6 lbs. Ethnicity:  White                            Exam Date: 04/30/2017 FRAX* RESULTS:  (version: 3.5) 10-year Probability of Fracture1 Major Osteoporotic Fracture2 Hip Fracture 12.9% 0.7% Population: [place] (Caucasian) Risk Factors: Tobacco User (Current Smoker), Family Hist. (Parent hip fracture) Based on Femur (Right) Neck BMD 1 -The 10-year  probability of fracture may be lower than reported if the patient has received treatment. 2 -Major Osteoporotic Fracture: Clinical Spine, Forearm, Hip or Shoulder *FRAX is a Armed forces logistics/support/administrative officertradem[employer] of the [employer] Centre for Metabolic Bone Disease, a World Science writerHealth Organization (WHO) [employer]. ASSESSMENT: The probability of a major osteoporotic fracture is 12.9% within the next ten years. The probability of a hip fracture is 0.7% within the next ten years. Electronically Signed   By: Bretta BangWilliam  Woodruff III M.D.   On: 04/30/2017 09:29   Mm Digital Screening Bilateral  Result Date: 04/30/2017 CLINICAL DATA:  Screening. EXAM: DIGITAL SCREENING BILATERAL MAMMOGRAM WITH CAD COMPARISON:  Previous exam(s). ACR Breast Density Category b: There are scattered areas of fibroglandular density. FINDINGS: There are no findings suspicious for malignancy. Images were processed with CAD. IMPRESSION: No mammographic evidence of malignancy. A result letter of this screening mammogram will be mailed directly to the patient. RECOMMENDATION: Screening mammogram in one year. (Code:SM-B-01Y) BI-RADS CATEGORY  1: Negative. Electronically Signed   By: Edwin CapJennifer  Jarosz M.D.   On: 04/30/2017 10:00    Assessment & Plan:   Problem List Items Addressed This Visit    Adjustment disorder with mixed anxiety and depressed mood    Improved mood with increase in  cymbalta to 60 mg daily . Using alprazolam prn. The risks and benefits of benzodiazepine use were discussed with patient today including excessive sedation leading to respiratory depression,  impaired thinking/driving, and addiction.  Patient was advised to avoid concurrent use with alcohol, to use medication only as needed and not to share with others  .       Diet-controlled diabetes mellitus (HCC) - Primary     She did not tolerate  Metformin and has not taken it in over 6 months. .  Tobacco cessation advised, statin therapy advised but deferred.   Aspirin therapy advised. Diet and exercise regimen reinforced, and advised to reduce alcohol to one drink per night. She is advised to have a1c and cmet checked asap   Lab Results  Component Value Date   HGBA1C 6.7 (H) 03/15/2017   Lab Results  Component Value Date   MICROALBUR 1.2 03/15/2017   Lab Results  Component Value Date   CHOL 192 03/15/2017   HDL 32.10 (L) 03/15/2017   LDLCALC 127 08/09/2015   LDLDIRECT 95.0 03/15/2017   TRIG 362.0 (H) 03/15/2017   CHOLHDL 6 03/15/2017         Relevant Orders   Hemoglobin A1c   Microalbumin / creatinine urine ratio   Comprehensive metabolic panel   Lipid panel  Essential hypertension    Well controlled on current regimen. Renal function is due , no changes today.        Relevant Medications   amLODipine (NORVASC) 10 MG tablet   Hyperlipidemia LDL goal <130    Uncontrolled due to patient preference      Relevant Medications   amLODipine (NORVASC) 10 MG tablet   Symptomatic menopausal or female climacteric states    lexapro wean written out.       Tobacco abuse counseling    Risks of continued tobacco use were discussed. She is not currently interested in tobacco cessation.          I have discontinued Cataleah E. Vankirk's Multiple Vitamins-Minerals (WOMENS MULTIVITAMIN PLUS PO). I have also changed her tiZANidine and ALPRAZolam. Additionally, I am having her maintain her levocetirizine, escitalopram, amLODipine, and DULoxetine.  Meds ordered this encounter  Medications  . tiZANidine (ZANAFLEX) 4 MG capsule    Sig: Take 1 capsule (4 mg total) by mouth 3 (three) times daily.    Dispense:  90 capsule    Refill:  2  . ALPRAZolam (XANAX) 1 MG tablet    Sig: One tablet daily as needed for anxiety    Dispense:  30 tablet    Refill:  5  . amLODipine (NORVASC) 10 MG tablet    Sig: TAKE ONE (1) TABLET EACH DAY    Dispense:  90 tablet    Refill:  1    KEEP ON FILE FOR FUTURE REFILLS.   NOTE DOSE CHANGE  . DULoxetine  (CYMBALTA) 60 MG capsule    Sig: TAKE 1 CAPSULE BY MOUTH EVERY DAY.    Dispense:  90 capsule    Refill:  1    Medications Discontinued During This Encounter  Medication Reason  . Multiple Vitamins-Minerals (WOMENS MULTIVITAMIN PLUS PO) Patient Preference  . tiZANidine (ZANAFLEX) 4 MG capsule Reorder  . ALPRAZolam (XANAX) 1 MG tablet Reorder  . amLODipine (NORVASC) 10 MG tablet Reorder  . DULoxetine (CYMBALTA) 60 MG capsule Reorder    Follow-up: No follow-ups on file.   Sherlene Shams, MD

## 2018-06-24 NOTE — Patient Instructions (Addendum)
You will need an A1c  Complete metabolic panel,  Fasting Lipid  profile and urinary microalb/cr ratio in October.   I have refilled your alprazolam for once daily use for 6 months    Lexapro:  1/2 tablet daily x 7 days,  Then 1/2 tablet every other day  7days  Then stop    YOU HAVE TO QUIT SMOKING !  Try a gradual reduction of 1 cigarette less each week

## 2018-06-26 NOTE — Assessment & Plan Note (Signed)
Well controlled on current regimen. Renal function is due, no changes today. °

## 2018-06-26 NOTE — Assessment & Plan Note (Signed)
lexapro wean written out.

## 2018-06-26 NOTE — Assessment & Plan Note (Signed)
Uncontrolled due to patient preference

## 2018-06-26 NOTE — Assessment & Plan Note (Signed)
Improved mood with increase in  cymbalta to 60 mg daily . Using alprazolam prn. The risks and benefits of benzodiazepine use were discussed with patient today including excessive sedation leading to respiratory depression,  impaired thinking/driving, and addiction.  Patient was advised to avoid concurrent use with alcohol, to use medication only as needed and not to share with others  .

## 2018-06-26 NOTE — Assessment & Plan Note (Signed)
She did not tolerate  Metformin and has not taken it in over 6 months. .  Tobacco cessation advised, statin therapy advised but deferred.  Aspirin therapy advised. Diet and exercise regimen reinforced, and advised to reduce alcohol to one drink per night. She is advised to have a1c and cmet checked asap   Lab Results  Component Value Date   HGBA1C 6.7 (H) 03/15/2017   Lab Results  Component Value Date   MICROALBUR 1.2 03/15/2017   Lab Results  Component Value Date   CHOL 192 03/15/2017   HDL 32.10 (L) 03/15/2017   LDLCALC 127 08/09/2015   LDLDIRECT 95.0 03/15/2017   TRIG 362.0 (H) 03/15/2017   CHOLHDL 6 03/15/2017

## 2018-06-26 NOTE — Assessment & Plan Note (Signed)
Risks of continued tobacco use were discussed. She is not currently interested in tobacco cessation.    

## 2018-08-04 ENCOUNTER — Telehealth: Payer: Self-pay

## 2018-08-04 NOTE — Telephone Encounter (Signed)
Copied from CRM [id number]. Topic: General - Other >> Aug 04, 2018  1:35 PM Marylen Ponto wrote: Reason for CRM: Pt has lab appt scheduled for 08/07/18 at 8 am. Pt requests to be contacted to be worked in for a follow up appt if necessary after the lab results come in.

## 2018-08-05 NOTE — Telephone Encounter (Signed)
Does pt need a follow up appt with you after her lab work on 08/07/2018. Pt has diagnosis of diet controlled diabetes and last appt was in August 2019?

## 2018-08-05 NOTE — Telephone Encounter (Signed)
Yes, thanks

## 2018-08-06 ENCOUNTER — Encounter: Payer: Self-pay | Admitting: *Deleted

## 2018-08-06 ENCOUNTER — Ambulatory Visit: Payer: Self-pay | Attending: Oncology | Admitting: *Deleted

## 2018-08-06 ENCOUNTER — Ambulatory Visit
Admission: RE | Admit: 2018-08-06 | Discharge: 2018-08-06 | Disposition: A | Discharge status: Home / Self Care | Payer: Self-pay | Source: Ambulatory Visit | Attending: Oncology | Admitting: Oncology

## 2018-08-06 VITALS — BP 125/83 | HR 94 | Temp 98.3°F

## 2018-08-06 DIAGNOSIS — N6489 Other specified disorders of breast: Secondary | ICD-10-CM

## 2018-08-06 DIAGNOSIS — Z0000 Encounter for general adult medical examination without abnormal findings: Secondary | ICD-10-CM | POA: Insufficient documentation

## 2018-08-06 NOTE — Telephone Encounter (Signed)
No problem.  I will contact her via Mychart abut her labs.  Ann Baxter

## 2018-08-06 NOTE — Progress Notes (Signed)
  Subjective:     Patient ID: Ann Baxter, female   DOB: Mar 03, 1961, 57 y.o.   MRN: [id number]  HPI   Review of Systems     Objective:   Physical Exam  Pulmonary/Chest: Right breast exhibits no inverted nipple, no mass, no nipple discharge, no skin change and no tenderness. Left breast exhibits no inverted nipple, no mass, no nipple discharge, no skin change and no tenderness.       Assessment:     57 year old female returns to [facility] for annual screening.  Clinical breast exam unremarkable.  Taught self breast awareness.  Last pap on 08/19/15 was negative / negative.  Next pap due in 2021.  Patient has been screened for eligibility.  She does not have any insurance, Medicare or Medicaid.  She also meets financial eligibility.  Hand-out given on the Affordable Care Act. Risk Assessment    Risk Scores      08/06/2018   Last edited by: Scarlett Presto, RN   5-year risk: 1.5 %   Lifetime risk: 8.5 %            Plan:     Screening mammogram ordered.  Will follow-up per [facility] protocol.

## 2018-08-06 NOTE — Patient Instructions (Signed)
Gave patient hand-out, Women Staying Healthy, Active and Well from [facility], with education on breast health, pap smears, heart and colon health. 

## 2018-08-06 NOTE — Telephone Encounter (Signed)
Spoke with pt and she stated that she isn't able to schedule a follow up appt at this time. She stated that she does not have insurance at this time.

## 2018-08-07 ENCOUNTER — Other Ambulatory Visit (INDEPENDENT_AMBULATORY_CARE_PROVIDER_SITE_OTHER): Payer: Self-pay

## 2018-08-07 DIAGNOSIS — E119 Type 2 diabetes mellitus without complications: Secondary | ICD-10-CM

## 2018-08-07 LAB — COMPREHENSIVE METABOLIC PANEL
ALT: 19 U/L (ref 0–35)
AST: 17 U/L (ref 0–37)
Albumin: 4.3 g/dL (ref 3.5–5.2)
Alkaline Phosphatase: 87 U/L (ref 39–117)
BUN: 14 mg/dL (ref 6–23)
CHLORIDE: 104 meq/L (ref 96–112)
CO2: 28 meq/L (ref 19–32)
CREATININE: 0.72 mg/dL (ref 0.40–1.20)
Calcium: 9.4 mg/dL (ref 8.4–10.5)
GFR: 88.17 mL/min (ref >60.00)
Glucose, Bld: 131 mg/dL — ABNORMAL HIGH (ref 70–99)
Potassium: 3.9 mEq/L (ref 3.5–5.1)
Sodium: 141 mEq/L (ref 135–145)
Total Bilirubin: 0.5 mg/dL (ref 0.2–1.2)
Total Protein: 7.3 g/dL (ref 6.0–8.3)

## 2018-08-07 LAB — LIPID PANEL
CHOL/HDL RATIO: 5
CHOLESTEROL: 212 mg/dL — AB (ref 0–200)
HDL: 45.2 mg/dL (ref >39.00)
LDL CALC: 131 mg/dL — AB (ref 0–99)
NonHDL: 166.9
Triglycerides: 182 mg/dL — ABNORMAL HIGH (ref 0.0–149.0)
VLDL: 36.4 mg/dL (ref 0.0–40.0)

## 2018-08-07 LAB — MICROALBUMIN / CREATININE URINE RATIO
Creatinine,U: 218.3 mg/dL
Microalb Creat Ratio: 2.2 mg/g (ref 0.0–30.0)
Microalb, Ur: 4.8 mg/dL — ABNORMAL HIGH (ref 0.0–1.9)

## 2018-08-07 LAB — HEMOGLOBIN A1C: Hgb A1c MFr Bld: 6 % (ref 4.6–6.5)

## 2018-08-18 ENCOUNTER — Ambulatory Visit
Admission: RE | Admit: 2018-08-18 | Discharge: 2018-08-18 | Disposition: A | Discharge status: Home / Self Care | Payer: Self-pay | Source: Ambulatory Visit | Attending: Oncology | Admitting: Oncology

## 2018-08-18 DIAGNOSIS — N6489 Other specified disorders of breast: Secondary | ICD-10-CM

## 2018-08-20 ENCOUNTER — Encounter: Payer: Self-pay | Admitting: *Deleted

## 2018-08-20 NOTE — Progress Notes (Signed)
Letter mailed from [facility] to inform patient of her normal mammogram results.  Patient is to follow-up with annual screening in one year.  HSIS to Christy. 

## 2018-10-14 ENCOUNTER — Telehealth: Payer: Self-pay | Admitting: Internal Medicine

## 2018-10-14 NOTE — Telephone Encounter (Signed)
Copied from CRM [id number]. Topic: Quick Communication - See Telephone Encounter >> Oct 14, 2018  1:42 PM Angela NevinWilliams, Candice N wrote: CRM for notification. See Telephone encounter for: 10/14/18.  Patient calling to request Dr. Darrick Huntsmanullo consider upping her dosage for DULoxetine (CYMBALTA) 60 MG capsule. Patient states she would like to try a higher dose as she has felt more "snappy" and moody lately although wanting to note that it may be situational (upcoming holiday). Please advise.

## 2018-10-15 MED ORDER — DULOXETINE HCL 30 MG PO CPEP: 90.0000 mg | ORAL_CAPSULE | Freq: Every day | ORAL | 0 refills | Status: DC

## 2018-10-15 NOTE — Telephone Encounter (Signed)
DOSE INCREASED TO 90 MG  FOR ONE MONTH,  NEEDS TO TAEK 3 30 MG CAPSULES DAILY,  NEEDS AN OFFICE VISIT PRIOR TO REFILL

## 2018-10-15 NOTE — Telephone Encounter (Signed)
LMTCB. [facility] may speak with pt.  

## 2018-10-15 NOTE — Telephone Encounter (Signed)
Spoke with patient. She will pick up Cymbalta 30 MG at pharmacy. Reviewed instructions with patient. Patient understands she will need a follow-up with Dr. Darrick Huntsmanullo prior to refills. Patient will schedule a MyChart visit with Dr. Darrick Huntsmanullo for later in January. She reports she does not have insurance at this time and MyChart would work best for her.

## 2018-11-10 ENCOUNTER — Other Ambulatory Visit: Payer: Self-pay

## 2018-11-12 MED ORDER — DULOXETINE HCL 30 MG PO CPEP: 90.0000 mg | ORAL_CAPSULE | Freq: Every day | ORAL | 2 refills | Status: DC

## 2018-12-31 ENCOUNTER — Other Ambulatory Visit: Payer: Self-pay | Admitting: Internal Medicine

## 2018-12-31 MED ORDER — AMLODIPINE BESYLATE 10 MG PO TABS: ORAL_TABLET | ORAL | 0 refills | Status: DC

## 2018-12-31 NOTE — Telephone Encounter (Signed)
Copied from CRM [id number]. Topic: Quick Communication - Rx Refill/Question >> Dec 31, 2018 11:30 AM Wyonia Hough E wrote: Medication: amLODipine (NORVASC) 10 MG tablet  Has the patient contacted their pharmacy? No   Preferred Pharmacy (with phone number or street name): [facility] [place], [place] - [address] [phone] (Phone) [phone] (Fax)    Agent: Please be advised that RX refills may take up to 3 business days. We ask that you follow-up with your pharmacy.

## 2018-12-31 NOTE — Telephone Encounter (Signed)
Patient has appointment scheduled 02/03/19- courtesy refill given. Requested Prescriptions  Pending Prescriptions Disp Refills  . amLODipine (NORVASC) 10 MG tablet 90 tablet 0    Sig: TAKE ONE (1) TABLET EACH DAY     Cardiovascular:  Calcium Channel Blockers Failed - 12/31/2018 11:33 AM      Failed - Valid encounter within last 6 months    Recent Outpatient Visits          6 months ago Diet-controlled diabetes mellitus (HCC)   [facility] Sherlene Shams, MD   1 year ago Need for 23-polyvalent pneumococcal polysaccharide vaccine   [facility] Sherlene Shams, MD   1 year ago Other fatigue   [facility] Sherlene Shams, MD   2 years ago Labial abscess   [facility] [place] Sherlene Shams, MD   2 years ago Diet-controlled diabetes mellitus [facility])   [facility] [place] Sherlene Shams, MD      Future Appointments            In 1 month Sherlene Shams, MD [facility] [place], [facility]           Passed - Last BP in normal range    BP Readings from Last 1 Encounters:  08/06/18 125/83

## 2019-01-12 ENCOUNTER — Other Ambulatory Visit: Payer: Self-pay | Admitting: Internal Medicine

## 2019-01-28 ENCOUNTER — Telehealth: Payer: Self-pay

## 2019-01-28 NOTE — Telephone Encounter (Signed)
Pt called in about medication follow-up appt scheduled for 02/03/19 @ 2:30 pm. Pt says that she has a smart phone with a camera and ok with having a virtual visit.    Pt's email address:  [email or web address]   Cell phone:  [phone].

## 2019-01-28 NOTE — Telephone Encounter (Signed)
appt has been changed to a virtual visit.  

## 2019-02-03 ENCOUNTER — Ambulatory Visit (INDEPENDENT_AMBULATORY_CARE_PROVIDER_SITE_OTHER): Payer: Self-pay | Admitting: Internal Medicine

## 2019-02-03 DIAGNOSIS — I10 Essential (primary) hypertension: Secondary | ICD-10-CM

## 2019-02-03 DIAGNOSIS — G4733 Obstructive sleep apnea (adult) (pediatric): Secondary | ICD-10-CM

## 2019-02-03 DIAGNOSIS — E119 Type 2 diabetes mellitus without complications: Secondary | ICD-10-CM

## 2019-02-03 DIAGNOSIS — F331 Major depressive disorder, recurrent, moderate: Secondary | ICD-10-CM

## 2019-02-03 MED ORDER — ALPRAZOLAM 1 MG PO TABS: 0.5000 mg | ORAL_TABLET | Freq: Three times a day (TID) | ORAL | 2 refills | Status: AC | PRN

## 2019-02-03 NOTE — Progress Notes (Signed)
Virtual Visit via  Note  This visit type was conducted due to national recommendations for restrictions regarding the COVID-19 pandemic (e.g. social distancing).  This format is felt to be most appropriate for this patient at this time.  All issues noted in this document were discussed and addressed.  No physical exam was performed (except for noted visual exam findings with Video Visits).   I connected with@ on 02/04/19 at  2:30 PM EDT by a video enabled telemedicine application or telephone and verified that I am speaking with the correct person using two identifiers. Location patient: home Location provider: work Persons participating in the virtual visit: patient, provider  I discussed the limitations, risks, security and privacy concerns of performing an evaluation and management service by telephone and the availability of in person appointments. I also discussed with the patient that there may be a patient responsible charge related to this service. The patient expressed understanding and agreed to proceed.  Reason for visit:  Medication refill  HPI:   Follow up on type 2 DM,  GAD, HTN and OSA  1) 6 month follow up on diabetes.  Patient has no complaints today.  Patient is following a low glycemic index diet 75% of the time and controlling her diabetes with diet only,  She does not check her blood sugars.. Patient is walking about 3 times per week and not ntentionally trying to lose weight .  Patient has NOT  an eye exam in the last 12 months and checks feet regularly for signs of infection.  Patient does not walk barefoot outside,  And denies  numbness tingling or burning in feet. Patient is up to date on all recommended vaccinations  2) Anxiety : current ongoing stressors include loss of income and isolation from [place], as well as  her adult sons' impending divorce and the impact on her grandchildren.   REQUESTING refill on oral chlorhexidine rinse previously prescribed by her  dentist for gingivitis. Dental office is closed  3) HTN: Patient is taking her medications as prescribed and notes no adverse effects.  Home BP readings have been done about once per week and are  generally < 130/80 .  She is avoiding added salt in her diet and walking regularly about 3 times per week for exercise     4) OSA:  Diagnosed by sleep study  In 2017 [facility] . She reports that she is  wearing her CPAP every night a minimum of 6 hours per night and notes improved daytime wakefulness and decreased fatigue    ROS: See pertinent positives and negatives per HPI.  Past Medical History:  Diagnosis Date  . Allergy   . Bronchitis, chronic ([place])   . GERD (gastroesophageal reflux disease)    H Pylori Positive, treated July 2011  . Tobacco abuse     Past Surgical History:  Procedure Laterality Date  . CESAREAN SECTION  1986    Family History  Problem Relation Age of Onset  . Hyperlipidemia Mother   . Diabetes Mother   . Hypertension Mother   . Mental illness Mother 51       dementia  . Depression Mother   . Hyperlipidemia Father   . Diabetes Father   . Cancer Maternal Aunt   . Breast cancer Maternal Aunt     SOCIAL HX: , self employed.   Current Outpatient Medications:  .  ALPRAZolam (XANAX) 1 MG tablet, Take 0.5 tablets (0.5 mg total) by mouth 3 (three)  times daily as needed for anxiety., Disp: 45 tablet, Rfl: 2 .  amLODipine (NORVASC) 10 MG tablet, TAKE ONE (1) TABLET EACH DAY, Disp: 90 tablet, Rfl: 0 .  DULoxetine (CYMBALTA) 30 MG capsule, Take 3 capsules (90 mg total) by mouth daily., Disp: 90 capsule, Rfl: 2 .  levocetirizine (XYZAL) 5 MG tablet, Take 5 mg by mouth every evening., Disp: , Rfl:  .  Magnesium 250 MG TABS, Take 1 tablet by mouth daily., Disp: , Rfl:  .  tiZANidine (ZANAFLEX) 4 MG capsule, Take 1 capsule (4 mg total) by mouth 3 (three) times daily., Disp: 90 capsule, Rfl: 2 .  chlorhexidine gluconate, MEDLINE KIT, (PERIDEX) 0.12 % solution, Use as  directed 15 mLs in the mouth or throat 2 (two) times daily., Disp: 120 mL, Rfl: 2  EXAM:  VITALS per patient if applicable:  GENERAL: alert, oriented, appears well and in no acute distress  HEENT: atraumatic, conjunttiva clear, no obvious abnormalities on inspection of external nose and ears  NECK: normal movements of the head and neck  LUNGS: on inspection no signs of respiratory distress, breathing rate appears normal, no obvious gross SOB, gasping or wheezing  CV: no obvious cyanosis  MS: moves all visible extremities without noticeable abnormality  PSYCH/NEURO: pleasant and cooperative, no obvious depression or anxiety, speech and thought processing grossly intact  ASSESSMENT AND PLAN:  Discussed the following assessment and plan:  Diet-controlled diabetes mellitus (HCC) - Plan: Hemoglobin A1c, Comprehensive metabolic panel  Major depressive disorder, recurrent episode, moderate (HCC)  Essential hypertension  OSA (obstructive sleep apnea)  Major depressive disorder, recurrent episode, moderate (HCC) With concurrent anxiety aggravated by  current events  Son's marital issues.  She denies any symptoms of persistent depression and is tolerating her medications without excess sedation . Sh eis on maximal cymbalta dose .  She has been  Using alprazolam more frequently ,  2 to 3 times daily.  Reviewed prior alternative medications which were too sedating or not effective, including clonazepam.  No chages today.  The risks and benefits of  Chronic  benzodiazepine use were discussed with patient today including increased risk of dementia,  Addiction, and seizures if abruptly withdrawn  .  Patient was encouraged to reduce use of xanax ,  Starting with reduction of use to 2 times daily   Essential hypertension Well controlled on current regimen of amlodipine 10 mg daily .   Diet-controlled diabetes mellitus ([place])  She did not tolerate  Metformin and has not taken it in over 6 months.  .  Tobacco cessation again advised, and statin therapy advised at last visit but again deferred.  Aspirin therapy advised. Diet and exercise regimen reinforced, and advised to reduce alcohol to one drink per night. She is advised to have a1c and cmet checked asap   Lab Results  Component Value Date   HGBA1C 6.3 02/04/2019   Lab Results  Component Value Date   MICROALBUR 4.8 (H) 08/07/2018   Lab Results  Component Value Date   CHOL 212 (H) 08/07/2018   HDL 45.20 08/07/2018   LDLCALC 131 (H) 08/07/2018   LDLDIRECT 95.0 03/15/2017   TRIG 182.0 (H) 08/07/2018   CHOLHDL 5 08/07/2018     OSA (obstructive sleep apnea) Diagnosed by prior sleep study. Patient is using CPAP every night a minimum of 6 hours per night and notes improved daytime wakefulness and decreased fatigue     I discussed the assessment and treatment plan with the patient. The patient was  provided an opportunity to ask questions and all were answered. The patient agreed with the plan and demonstrated an understanding of the instructions.   The patient was advised to call back or seek an in-person evaluation if the symptoms worsen or if the condition fails to improve as anticipated.  I provided  30 minutes of non-face-to-face time during this encounter.   Crecencio Mc, MD

## 2019-02-04 ENCOUNTER — Telehealth: Payer: Self-pay | Admitting: Internal Medicine

## 2019-02-04 ENCOUNTER — Other Ambulatory Visit: Payer: Self-pay

## 2019-02-04 ENCOUNTER — Other Ambulatory Visit (INDEPENDENT_AMBULATORY_CARE_PROVIDER_SITE_OTHER): Payer: Self-pay

## 2019-02-04 ENCOUNTER — Encounter: Payer: Self-pay | Admitting: Internal Medicine

## 2019-02-04 DIAGNOSIS — E119 Type 2 diabetes mellitus without complications: Secondary | ICD-10-CM

## 2019-02-04 LAB — COMPREHENSIVE METABOLIC PANEL
ALT: 26 U/L (ref 0–35)
AST: 18 U/L (ref 0–37)
Albumin: 4.4 g/dL (ref 3.5–5.2)
Alkaline Phosphatase: 99 U/L (ref 39–117)
BUN: 17 mg/dL (ref 6–23)
CO2: 25 mEq/L (ref 19–32)
Calcium: 9.1 mg/dL (ref 8.4–10.5)
Chloride: 100 mEq/L (ref 96–112)
Creatinine, Ser: 0.74 mg/dL (ref 0.40–1.20)
GFR: 80.24 mL/min (ref >60.00)
Glucose, Bld: 222 mg/dL — ABNORMAL HIGH (ref 70–99)
Potassium: 3.7 mEq/L (ref 3.5–5.1)
Sodium: 137 mEq/L (ref 135–145)
Total Bilirubin: 0.5 mg/dL (ref 0.2–1.2)
Total Protein: 6.9 g/dL (ref 6.0–8.3)

## 2019-02-04 LAB — HEMOGLOBIN A1C: Hgb A1c MFr Bld: 6.3 % (ref 4.6–6.5)

## 2019-02-04 MED ORDER — CHLORHEXIDINE GLUCONATE 0.12% ORAL RINSE (MEDLINE KIT): 15.0000 mL | Freq: Two times a day (BID) | OROMUCOSAL | 2 refills | Status: AC

## 2019-02-04 NOTE — Assessment & Plan Note (Signed)
Diagnosed by prior sleep study. Patient is using CPAP every night a minimum of 6 hours per night and notes improved daytime wakefulness and decreased fatigue  

## 2019-02-04 NOTE — Telephone Encounter (Signed)
Yes, but it cannot be e  Sent rx printed

## 2019-02-04 NOTE — Telephone Encounter (Signed)
Signed and faxed

## 2019-02-04 NOTE — Assessment & Plan Note (Addendum)
With concurrent anxiety aggravated by  current events  Son's marital issues.  She denies any symptoms of persistent depression and is tolerating her medications without excess sedation . Sh eis on maximal cymbalta dose .  She has been  Using alprazolam more frequently ,  2 to 3 times daily.  Reviewed prior alternative medications which were too sedating or not effective, including clonazepam.  No chages today.  The risks and benefits of  Chronic  benzodiazepine use were discussed with patient today including increased risk of dementia,  Addiction, and seizures if abruptly withdrawn  .  Patient was encouraged to reduce use of xanax ,  Starting with reduction of use to 2 times daily

## 2019-02-04 NOTE — Telephone Encounter (Signed)
Pt has a oral mouth wash prescribed from the oral surgeon. Pt would like to see if Dr Darrick Huntsman would give her a prescription for it. Please advise?  Medication is Chlorhexidine Gluconate 0.12% Oral Rinse   Pharmacy is [facility], [place] - [address]  Call pt @ [phone]. Thank you!

## 2019-02-04 NOTE — Assessment & Plan Note (Signed)
She did not tolerate  Metformin and has not taken it in over 6 months. .  Tobacco cessation again advised, and statin therapy advised at last visit but again deferred.  Aspirin therapy advised. Diet and exercise regimen reinforced, and advised to reduce alcohol to one drink per night. She is advised to have a1c and cmet checked asap   Lab Results  Component Value Date   HGBA1C 6.3 02/04/2019   Lab Results  Component Value Date   MICROALBUR 4.8 (H) 08/07/2018   Lab Results  Component Value Date   CHOL 212 (H) 08/07/2018   HDL 45.20 08/07/2018   LDLCALC 131 (H) 08/07/2018   LDLDIRECT 95.0 03/15/2017   TRIG 182.0 (H) 08/07/2018   CHOLHDL 5 08/07/2018

## 2019-02-04 NOTE — Assessment & Plan Note (Signed)
Well controlled on current regimen of amlodipine 10 mg daily .

## 2019-02-20 ENCOUNTER — Other Ambulatory Visit: Payer: Self-pay | Admitting: Internal Medicine

## 2019-03-24 ENCOUNTER — Other Ambulatory Visit: Payer: Self-pay | Admitting: Internal Medicine

## 2019-04-08 ENCOUNTER — Other Ambulatory Visit: Payer: Self-pay | Admitting: Internal Medicine

## 2019-05-12 ENCOUNTER — Emergency Department
Admission: EM | Admit: 2019-05-12 | Discharge: 2019-05-12 | Disposition: A | Discharge status: Home / Self Care | Payer: Self-pay | Attending: Emergency Medicine | Admitting: Emergency Medicine

## 2019-05-12 ENCOUNTER — Other Ambulatory Visit: Payer: Self-pay

## 2019-05-12 DIAGNOSIS — Z79899 Other long term (current) drug therapy: Secondary | ICD-10-CM | POA: Insufficient documentation

## 2019-05-12 DIAGNOSIS — F17210 Nicotine dependence, cigarettes, uncomplicated: Secondary | ICD-10-CM | POA: Insufficient documentation

## 2019-05-12 DIAGNOSIS — I10 Essential (primary) hypertension: Secondary | ICD-10-CM | POA: Insufficient documentation

## 2019-05-12 DIAGNOSIS — B9689 Other specified bacterial agents as the cause of diseases classified elsewhere: Secondary | ICD-10-CM | POA: Insufficient documentation

## 2019-05-12 DIAGNOSIS — J019 Acute sinusitis, unspecified: Secondary | ICD-10-CM | POA: Insufficient documentation

## 2019-05-12 MED ORDER — BUTALBITAL-APAP-CAFFEINE 50-325-40 MG PO TABS: 1.0000 | ORAL_TABLET | Freq: Four times a day (QID) | ORAL | 0 refills | Status: AC | PRN

## 2019-05-12 MED ORDER — KETOROLAC TROMETHAMINE 30 MG/ML IJ SOLN
30.0000 mg | Freq: Once | INTRAMUSCULAR | Status: AC
  Administered 2019-05-12: 30 mg via INTRAMUSCULAR
  Filled 2019-05-12: qty 1

## 2019-05-12 MED ORDER — AMOXICILLIN-POT CLAVULANATE 875-125 MG PO TABS: 1.0000 | ORAL_TABLET | Freq: Two times a day (BID) | ORAL | 0 refills | Status: AC

## 2019-05-12 MED ORDER — AMOXICILLIN-POT CLAVULANATE 875-125 MG PO TABS
1.0000 | ORAL_TABLET | Freq: Once | ORAL | Status: AC
  Administered 2019-05-12: 1 via ORAL
  Filled 2019-05-12: qty 1

## 2019-05-12 MED ORDER — PROMETHAZINE HCL 25 MG/ML IJ SOLN
25.0000 mg | Freq: Once | INTRAMUSCULAR | Status: AC
  Administered 2019-05-12: 25 mg via INTRAMUSCULAR
  Filled 2019-05-12: qty 1

## 2019-05-12 MED ORDER — DIPHENHYDRAMINE HCL 50 MG/ML IJ SOLN
50.0000 mg | Freq: Once | INTRAMUSCULAR | Status: DC
  Filled 2019-05-12: qty 1

## 2019-05-12 NOTE — ED Triage Notes (Signed)
First Nurse Note:  Arrives from [facility] for ED evaluation.  Patient AAOx3.  Skin warm and dry. NAD

## 2019-05-12 NOTE — ED Notes (Signed)
Pt updated that PA will be in with pt ASAP

## 2019-05-12 NOTE — ED Notes (Signed)
See triage note  Presents with swelling around left eye  Denies any injury but has hx of allergies

## 2019-05-12 NOTE — ED Provider Notes (Signed)
[facility] Emergency Department Provider Note  ____________________________________________  Time seen: Approximately 7:18 PM  I have reviewed the triage vital signs and the nursing notes.   HISTORY  Chief Complaint eye swelling    HPI Ann Baxter is a 58 y.o. female who presents the emergency department complaining of June earlier the left eye, sinus pressure, left-sided headache.  Patient reports that she woke up this morning with mild edema under her left eye.  Patient reports that she has significant allergies and associated with her allergies.  Patient reports that throughout the day she has had increasing pressure under and next to the left eye with increasing headache.  No fevers or chills, she does have constant nasal congestion and sneezing.  Patient reports that she went to urgent care for an antibiotic, was referred to emergency department after being scared by urgent care.  Patient reports that she has had meningitis in the past, states that the symptoms are not consistent with any kind of central nervous system infection but was scared as urgent care referred her because they thought she may have an infection in her brain.  Patient denies any neck stiffness, chest pain, shortness of breath, coughing, abdominal pain, nausea vomiting.  Patient has taken her allergy medications with no relief.         Past Medical History:  Diagnosis Date  . Allergy   . Bronchitis, chronic ([place])   . GERD (gastroesophageal reflux disease)    H Pylori Positive, treated July 2011  . Tobacco abuse     Patient Active Problem List   Diagnosis Date Noted  . Osteopenia 09/17/2017  . Chronic midline thoracic back pain 09/17/2017  . OSA (obstructive sleep apnea) 03/29/2016  . Snoring 02/15/2016  . Adjustment disorder with mixed anxiety and depressed mood 08/21/2015  . Visit for preventive health examination 08/21/2015  . Symptomatic menopausal or female climacteric  states 08/21/2015  . Diet-controlled diabetes mellitus ([place]) 08/21/2015  . S/P carpal tunnel release 03/03/2015  . Essential hypertension 03/03/2015  . Hyperlipidemia LDL goal <130 08/26/2013  . Pain of left shoulder joint on movement 01/12/2013  . Tobacco abuse counseling 09/25/2011  . Major depressive disorder, recurrent episode, moderate ([place]) 09/25/2011  . Allergy   . GERD (gastroesophageal reflux disease)   . Tobacco abuse     Past Surgical History:  Procedure Laterality Date  . [place]    Prior to Admission medications   Medication Sig Start Date End Date Taking? Authorizing Provider  ALPRAZolam Duanne Moron) 1 MG tablet Take 0.5 tablets (0.5 mg total) by mouth 3 (three) times daily as needed for anxiety. 02/03/19   Crecencio Mc, MD  amLODipine (NORVASC) 10 MG tablet TAKE 1 TABLET BY MOUTH DAILY 04/08/19   Crecencio Mc, MD  amoxicillin-clavulanate (AUGMENTIN) 875-125 MG tablet Take 1 tablet by mouth 2 (two) times daily. 05/12/19   Meerab Maselli, Charline Bills, PA-C  butalbital-acetaminophen-caffeine (FIORICET) [phone] MG tablet Take 1-2 tablets by mouth every 6 (six) hours as needed for headache. 05/12/19 05/11/20  Daryle Boyington, Charline Bills, PA-C  chlorhexidine gluconate, MEDLINE KIT, (PERIDEX) 0.12 % solution Use as directed 15 mLs in the mouth or throat 2 (two) times daily. 02/04/19   Crecencio Mc, MD  DULoxetine (CYMBALTA) 30 MG capsule TAKE 3 CAPSULES BY MOUTH DAILY 02/20/19   Crecencio Mc, MD  levocetirizine (XYZAL) 5 MG tablet Take 5 mg by mouth every evening.    [provider]  Magnesium 250 MG  TABS Take 1 tablet by mouth daily.    [provider]  tiZANidine (ZANAFLEX) 4 MG capsule Take 1 capsule (4 mg total) by mouth 3 (three) times daily. 06/24/18   Crecencio Mc, MD    Allergies Codeine, Lyrica [pregabalin], and Prednisone  Family History  Problem Relation Age of Onset  . Hyperlipidemia Mother   . Diabetes Mother   . Hypertension Mother    . Mental illness Mother 46       dementia  . Depression Mother   . Hyperlipidemia Father   . Diabetes Father   . Cancer Maternal Aunt   . Breast cancer Maternal Aunt     Social History Social History   Tobacco Use  . Smoking status: Current Every Day Smoker    Packs/day: 1.00    Years: 35.00    Pack years: 35.00    Types: Cigarettes  . Smokeless tobacco: Never Used  Substance Use Topics  . Alcohol use: Yes    Alcohol/week: 7.0 standard drinks    Types: 7 drink(s) per week  . Drug use: No     Review of Systems  Constitutional: No fever/chills Eyes: No visual changes. No discharge ENT: Positive for left-sided sinus pressure, left-sided headache, edema under the left eye. Cardiovascular: no chest pain. Respiratory: no cough. No SOB. Gastrointestinal: No abdominal pain.  No nausea, no vomiting.  No diarrhea.  No constipation. Musculoskeletal: Negative for musculoskeletal pain. Skin: Negative for rash, abrasions, lacerations, ecchymosis. Neurological: Positive for left-sided frontal headache but denies focal weakness or numbness. 10-point ROS otherwise negative.  ____________________________________________   PHYSICAL EXAM:  VITAL SIGNS: ED Triage Vitals  Enc Vitals Group     BP 05/12/19 1810 (!) 156/89     Pulse Rate 05/12/19 1810 99     Resp 05/12/19 1810 16     Temp 05/12/19 1810 99.2 F (37.3 C)     Temp Source 05/12/19 1810 Oral     SpO2 05/12/19 1810 98 %     Weight 05/12/19 1812 130 lb (59 kg)     Height 05/12/19 1812 '5\' 2"'  (1.575 m)     Head Circumference --      Peak Flow --      Pain Score 05/12/19 1811 7     Pain Loc --      Pain Edu? --      Excl. in [place]? --      Constitutional: Alert and oriented. Well appearing and in no acute distress. Eyes: Visualization of the left eye reveals mild erythema and edema of the skin overlying the maxillary sinus left side.  No evidence of preseptal cellulitis.  Conjunctivae are normal. PERRL. EOMI. no  purulent drainage.  Funduscopic exam reveals red reflex bilaterally.  Vasculature and optic disc is unremarkable bilaterally. Head: Atraumatic. ENT:      Ears:       Nose: No congestion/rhinnorhea.  Turbinates are boggy bilaterally, inflamed to the left turbinates.  Patient is tender to percussion along the left ethmoid sinus and left maxillary sinus.  No tenderness to percussion.      Mouth/Throat: Mucous membranes are moist.  Neck: No stridor.  Neck is supple full range of motion Hematological/Lymphatic/Immunilogical: No cervical lymphadenopathy. Cardiovascular: Normal rate, regular rhythm. Normal S1 and S2.  Good peripheral circulation. Respiratory: Normal respiratory effort without tachypnea or retractions. Lungs CTAB. Good air entry to the bases with no decreased or absent breath sounds. Musculoskeletal: Full range of motion to all extremities. No gross deformities  appreciated. Neurologic:  Normal speech and language. No gross focal neurologic deficits are appreciated.  Cranial nerves II through XII grossly intact.  Negative Romberg's and pronator drift. Skin:  Skin is warm, dry and intact. No rash noted. Psychiatric: Mood and affect are normal. Speech and behavior are normal. Patient exhibits appropriate insight and judgement.   ____________________________________________   LABS (all labs ordered are listed, but only abnormal results are displayed)  Labs Reviewed - No data to display ____________________________________________  EKG   ____________________________________________  RADIOLOGY   No results found.  ____________________________________________    PROCEDURES  Procedure(s) performed:    Procedures    Medications  amoxicillin-clavulanate (AUGMENTIN) 875-125 MG per tablet 1 tablet (has no administration in time range)  ketorolac (TORADOL) 30 MG/ML injection 30 mg (has no administration in time range)  promethazine (PHENERGAN) injection 25 mg (has no  administration in time range)  diphenhydrAMINE (BENADRYL) injection 50 mg (has no administration in time range)     ____________________________________________   INITIAL IMPRESSION / ASSESSMENT AND PLAN / ED COURSE  Pertinent labs & imaging results that were available during my care of the patient were reviewed by me and considered in my medical decision making (see chart for details).  Review of the [place] CSRS was performed in accordance of the [place] prior to dispensing any controlled drugs.           Patient's diagnosis is consistent with bacterial sinusitis.  Patient presented to emergency department with mild edema below the left eye with sinus pressure, headache.  Patient had been referred to the emergency department from urgent care this patient reports that provider was concerned that she may have an infection bleeding into the brain.  On exam, patient is neurologically intact.  Patient does have a history of viral meningitis approximately 30 years ago patient denies any symptoms approaching this severity.  Patient has no concerning symptoms for oral or bacterial meningitis.  Exam was reassuring with patient being neurologically intact.  Findings on physical exam are most consistent with ethmoid and maxillary sinus infection.  Differential did include preseptal cellulitis, periorbital cellulitis, meningitis, diagnosis was most consistent with bacterial sinusitis.  I discussed imaging and labs with patient.  Patient reports she does not want further testing at this time.  I discussed the low likelihood of any other differential and patient opts for medication in the emergency department but that I was unable to exclude other differentials without further testing.  Patient verbalizes understanding and opts for symptom relief of her headache as well as treatment for sinusitis.  I have discussed at length return precautions and signs and symptoms to be concerned for.  Patient verbalizes  understanding.  Patient is given first dose of antibiotic, as well as migraine cocktail emergency department. Patient will be discharged home with prescriptions for augmentin and fioricet. Patient is to follow up with primary care as needed or otherwise directed. Patient is given ED precautions to return to the ED for any worsening or new symptoms.     ____________________________________________  FINAL CLINICAL IMPRESSION(S) / ED DIAGNOSES  Final diagnoses:  Acute bacterial sinusitis      NEW MEDICATIONS STARTED DURING THIS VISIT:  ED Discharge Orders         Ordered    amoxicillin-clavulanate (AUGMENTIN) 875-125 MG tablet  2 times daily     05/12/19 2033    butalbital-acetaminophen-caffeine (FIORICET) 50-325-40 MG tablet  Every 6 hours PRN     05/12/19 2033  This chart was dictated using voice recognition software/Dragon. Despite best efforts to proofread, errors can occur which can change the meaning. Any change was purely unintentional.    Brynda Peon 05/12/19 2038    Nance Pear, MD 05/12/19 2043

## 2019-05-12 NOTE — ED Triage Notes (Signed)
Pt to ED reporting swelling around her left eye that started this morning. Pt stating she has "very bad allergies" and her head is now hurting on the left side. No changes in vision.

## 2019-05-18 ENCOUNTER — Other Ambulatory Visit: Payer: Self-pay | Admitting: Physician Assistant

## 2019-05-18 DIAGNOSIS — H5712 Ocular pain, left eye: Secondary | ICD-10-CM

## 2019-05-22 ENCOUNTER — Other Ambulatory Visit: Payer: Self-pay

## 2019-05-22 ENCOUNTER — Ambulatory Visit
Admission: RE | Admit: 2019-05-22 | Discharge: 2019-05-22 | Disposition: A | Discharge status: Home / Self Care | Payer: Self-pay | Source: Ambulatory Visit | Attending: Physician Assistant | Admitting: Physician Assistant

## 2019-05-22 DIAGNOSIS — H5712 Ocular pain, left eye: Secondary | ICD-10-CM | POA: Insufficient documentation

## 2019-05-22 LAB — POCT I-STAT CREATININE: Creatinine, Ser: 0.6 mg/dL (ref 0.44–1.00)

## 2019-05-22 MED ORDER — IOHEXOL 300 MG/ML  SOLN
75.0000 mL | Freq: Once | INTRAMUSCULAR | Status: AC | PRN
  Administered 2019-05-22: 12:00:00 75 mL via INTRAVENOUS

## 2019-06-22 ENCOUNTER — Other Ambulatory Visit: Payer: Self-pay | Admitting: Internal Medicine

## 2019-07-30 DEATH — deceased

## 2021-01-05 IMAGING — CT CT ORBITS WITH CONTRAST
3 series · 14 of 47 positions shown, 16 images · IV contrast (omnipaque)
Comparison: None.

CLINICAL DATA: Left eye pain and swelling.

EXAM:
CT ORBITS WITH CONTRAST
TECHNIQUE: Multidetector CT images was performed according to the standard
protocol following intravenous contrast administration.
CONTRAST:  75mL OMNIPAQUE IOHEXOL 300 MG/ML  SOLN

[Series 4: soft orbits · axial · 0.25mm/px · z∈[-544,-470]mm · 8 of 45 slices shown, 10 images]
[im 4/45  brain]
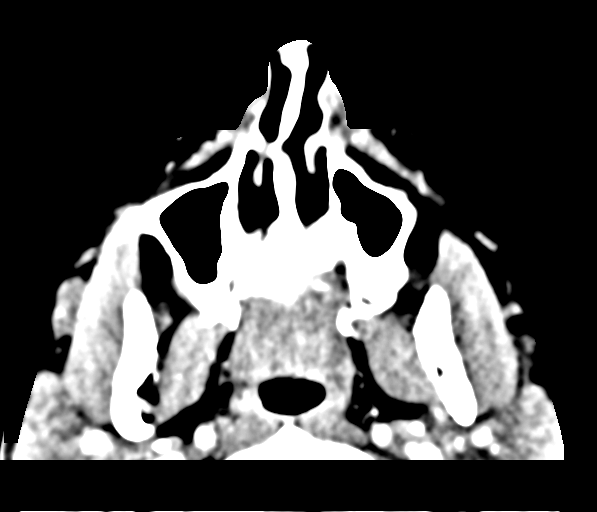
[im 4/45  bone]
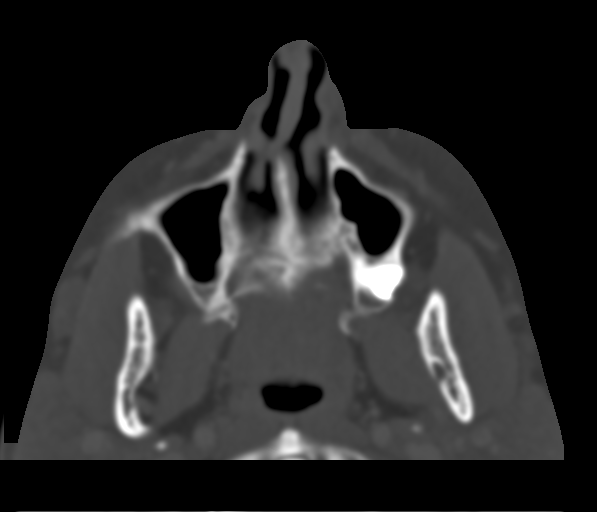
[im 10/45  bone]
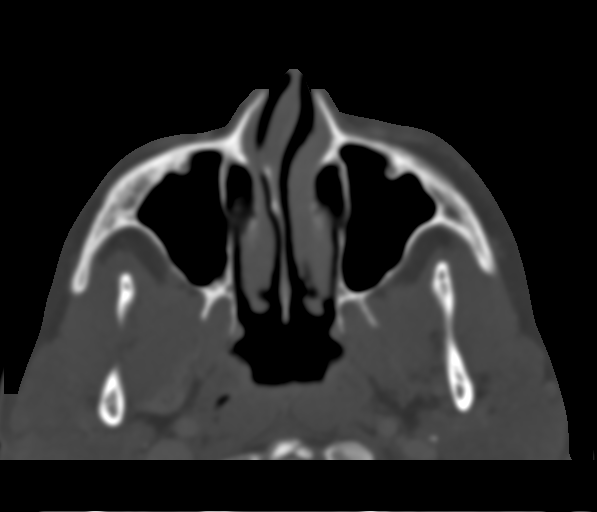
[im 14/45  bone]
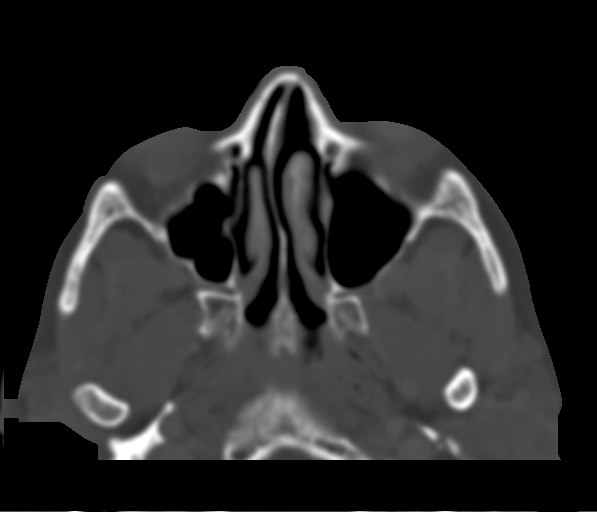
[im 20/45  bone]
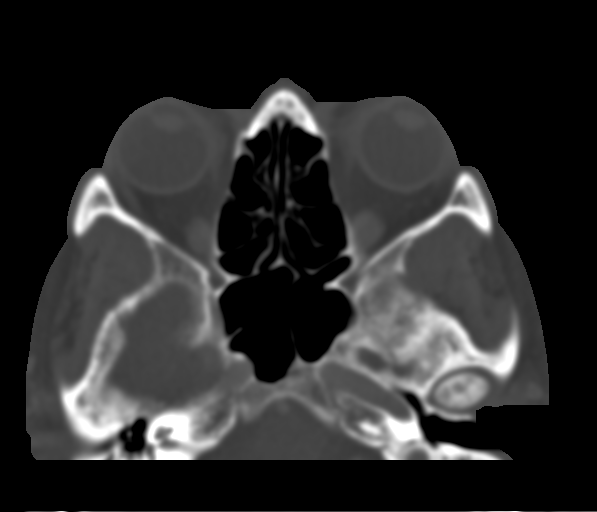
[im 25/45  brain]
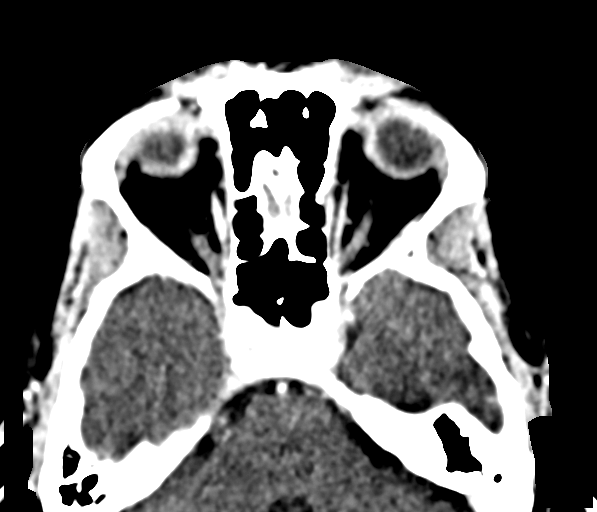
[im 25/45  bone]
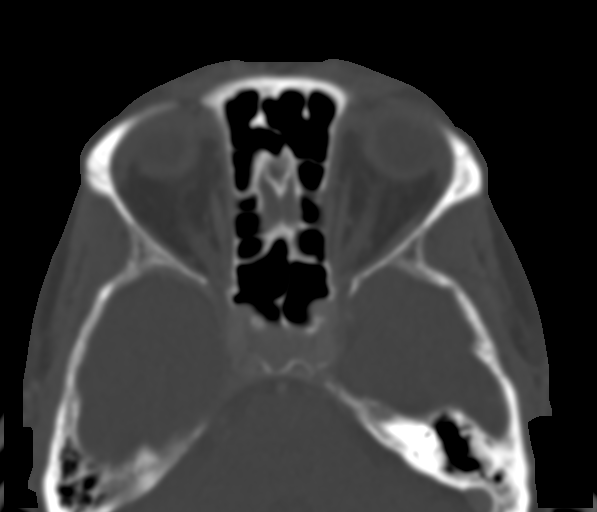
[im 31/45  bone]
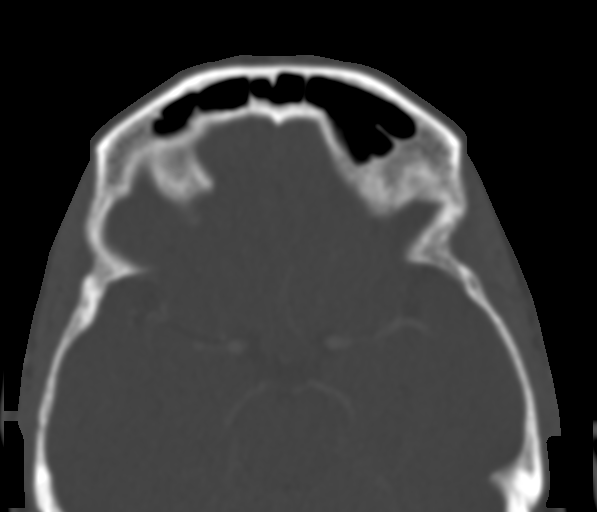
[im 35/45  bone]
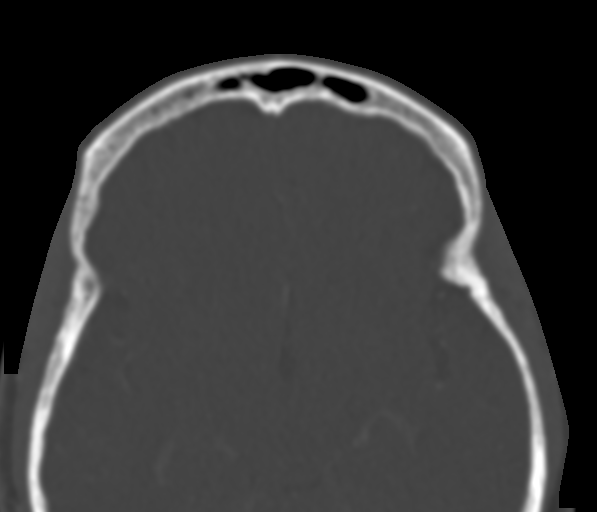
[im 41/45  bone]
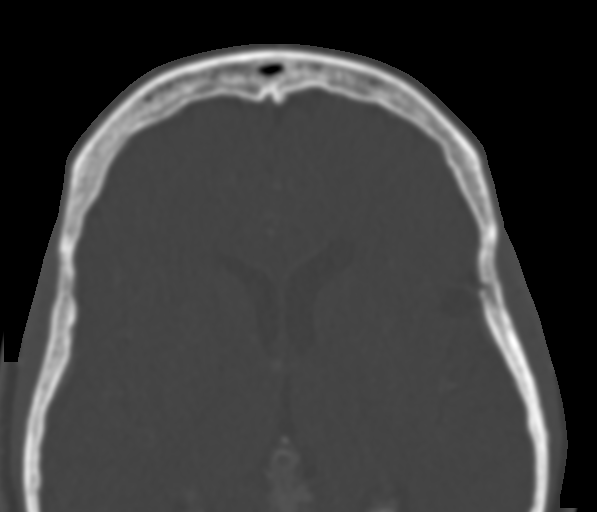

[Series 10: cor soft orbits · coronal · 0.18mm/px · 3 of 54 slices shown]
[im 18/54  bone]
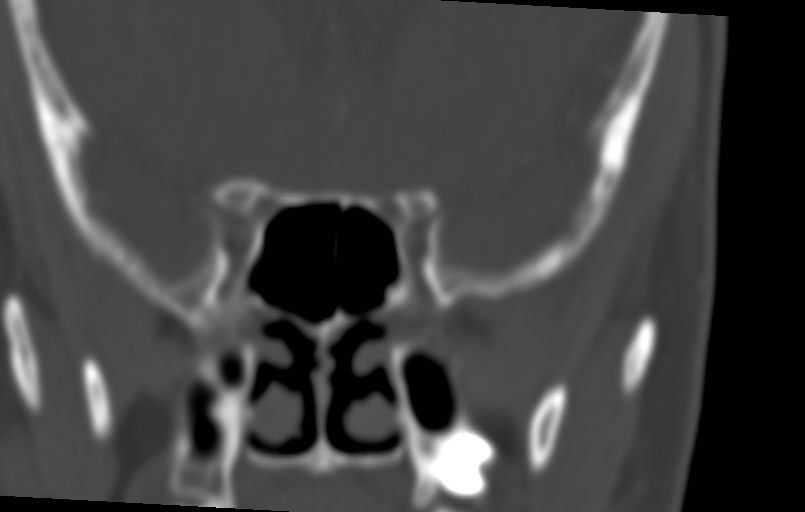
[im 24/54  bone]
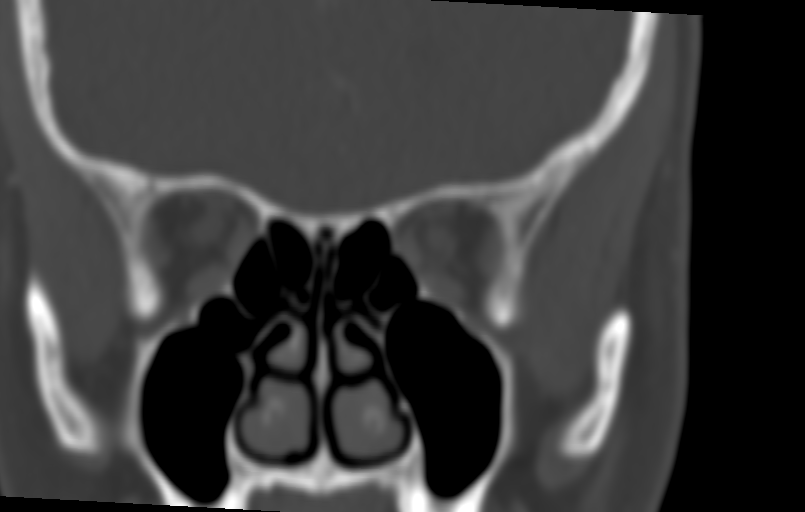
[im 30/54  bone]
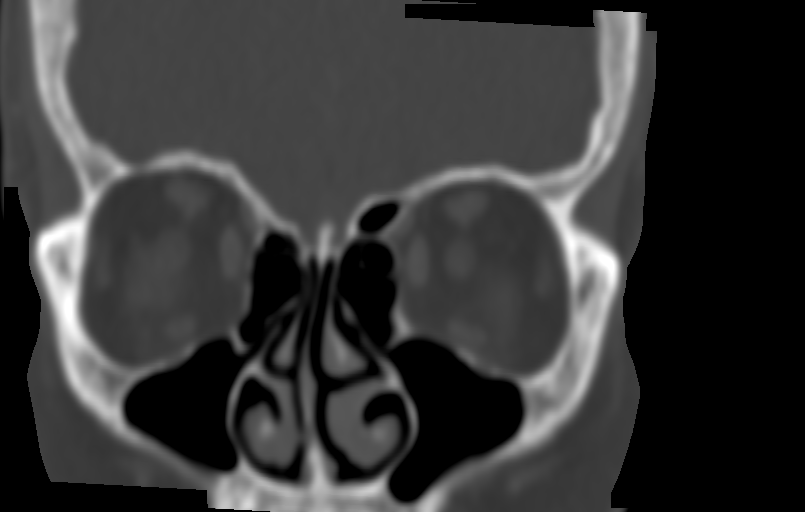

[Series 12: sag soft orbits · sagittal · 0.18mm/px · 3 of 71 slices shown]
[im 24/71  bone]
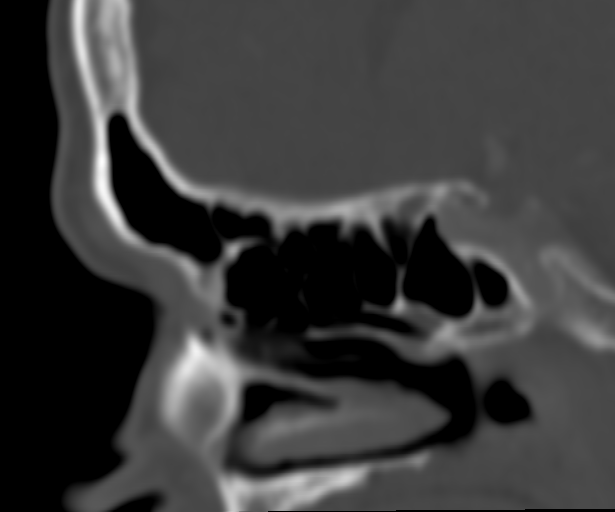
[im 36/71  bone]
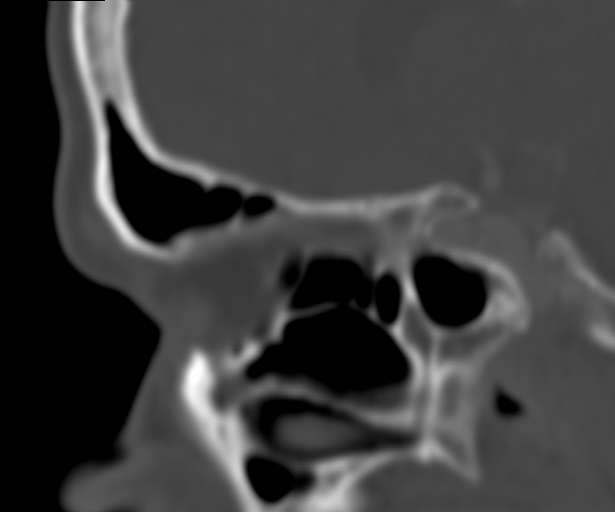
[im 47/71  bone]
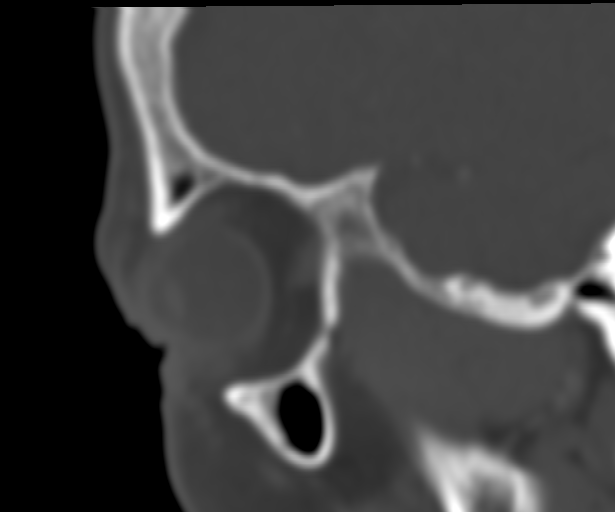

[14 of 47 positions shown; findings below may reference images not displayed]

FINDINGS: Orbits: Rim enhancing fluid collection in the left lower eyelid.
This measures approximately 5 mm in diameter. No other lesions are
seen in the orbit. Right orbit normal. Globe is normal bilaterally.
Optic nerve and extraocular muscles appear normal.

Visualized sinuses: Paranasal sinuses clear. No mucosal edema or
air-fluid level. Mastoid sinus and middle ear clear.

Soft tissues: No significant soft tissue swelling.  No mass.

Limited intracranial: Negative
IMPRESSION: 5 mm rim enhancing lesion in the soft tissues of the left lower
eyelid. Probable small abscess or obstructed gland.

These results will be called to the ordering clinician or
representative by the Radiologist Assistant, and communication
documented in the PACS or zVision Dashboard.
